# Patient Record
Sex: Female | Born: 1985 | Race: White | Hispanic: No | Marital: Married | Smoking: Former smoker
Health system: Southern US, Community
[De-identification: ages and names within clinical notes are randomized; demographics above are authoritative.]

## PROBLEM LIST (undated history)

## (undated) ENCOUNTER — Inpatient Hospital Stay: Admission: EM | Payer: Self-pay | Source: Home / Self Care

## (undated) ENCOUNTER — Inpatient Hospital Stay (HOSPITAL_COMMUNITY): Payer: Self-pay

## (undated) DIAGNOSIS — O24419 Gestational diabetes mellitus in pregnancy, unspecified control: Secondary | ICD-10-CM

## (undated) DIAGNOSIS — K859 Acute pancreatitis without necrosis or infection, unspecified: Secondary | ICD-10-CM

## (undated) DIAGNOSIS — N200 Calculus of kidney: Secondary | ICD-10-CM

## (undated) HISTORY — DX: Acute pancreatitis without necrosis or infection, unspecified: K85.90

## (undated) HISTORY — DX: Gestational diabetes mellitus in pregnancy, unspecified control: O24.419

---
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
## 2009-03-18 ENCOUNTER — Ambulatory Visit: Payer: Self-pay | Admitting: Family Medicine

## 2009-03-18 DIAGNOSIS — Z8719 Personal history of other diseases of the digestive system: Secondary | ICD-10-CM | POA: Insufficient documentation

## 2009-03-19 ENCOUNTER — Ambulatory Visit: Payer: Self-pay | Admitting: Family Medicine

## 2009-03-19 DIAGNOSIS — T23209A Burn of second degree of unspecified hand, unspecified site, initial encounter: Secondary | ICD-10-CM | POA: Insufficient documentation

## 2010-10-07 NOTE — Op Note (Signed)
Ruth Duran, Ruth Duran NO.:  [id number]   MEDICAL RECORD NO.:  [id number]          PATIENT TYPE:  INP   LOCATION:  9199                          FACILITY:  [facility]   PHYSICIAN:  Carrington Clamp, M.D. DATE OF BIRTH:  16-Dec-1985   DATE OF PROCEDURE:  11/30/2005  DATE OF DISCHARGE:                                 OPERATIVE REPORT   PREOPERATIVE DIAGNOSIS:  Third trimester bleeding, suspect abruption at  term.   POSTOPERATIVE DIAGNOSIS:  Abruption at term.   PROCEDURES:  Primary low transverse cesarean section.   ATTENDING:  Carrington Clamp, M.D.   ASSISTANT:  None.   ANESTHESIA:  Spinal.   SPECIMENS:  Placenta and cord gas.   DISPOSITION:  Pathology and blood bank respectively.   ESTIMATED BLOOD LOSS:  800 mL.   IV FLUIDS:  3200 mL.   URINE OUTPUT:  100 mL, clear.   COMPLICATIONS:  None.   FINDINGS:  Couvelaire's uterus.  There were also large clots seen coming out  with the placenta.  A female infant was found in vertex presentation, with  Apgars of seven and nine.  The cord pHs and weight is pending.  Weight was 6  pounds 5 ounces.  There were otherwise normal tubes and ovaries seen.   MEDICATIONS:  Ancef and Pitocin.  Counts were correct x3.   REASON FOR OPERATION:  Ruth Duran presented to maternity  admissions, at about 2:45 this afternoon, complaining of about 45 minutes of  bright red bleeding, heavier than a period.  When she was placed on the  monitor, after she got to the bathroom, the nurses noted the bright red  bleeding coming from the vagina and a decel to 80 beats per minute.  I was  then called and came right over to the hospital to evaluate.  I indeed found  that the patient was bleeding heavier right red blood than an average  period.  The cervix at that time was 1 cm and the bag of water appeared to  be intact.  The patient was complaining of severe pain that would only get  worse with the contractions, but would not let up  and between the  contractions, as well was the bright red bleeding.  At that time I suspected  abruption that was ongoing and continuing, and was going to be affecting the  baby, and made the decision for an emergent C-section.   The patient was taken the operating room where a spinal was placed and the  patient was prepped and draped in usual sterile fashion, in dorsal supine  position with leftward tilt.  A Pfannenstiel skin incision was made with  scalpel and carried down to the fascia with the Bovie cautery.  Fascia was  incised in midline with the scalpel and carried in transverse curvilinear  manner with the Mayo scissors.  Fascia was reflected superiorly and  inferiorly from the rectus muscles, and the rectus muscles split in the  midline.  The bowel free portion of peritoneum was entered into with the  Metzenbaum scissors, and then the peritoneum was stretched and opened in  superior and inferior manner with good visualization of the bowel and the  bladder.   The bladder blade was placed, and the vesicouterine fascia tented up and  incised in a transverse curvilinear manner, and bladder flap created.  A 2  cm incision was made in the upper portion of the lower uterine segment until  the amnion  and placenta could be seen.  The incision was extended  transversely with bandage scissors and the amnion ruptured and indicated  clear fluid.  The baby was delivered with vacuum extractor and the cord was  clamped and cut.  Baby was handed to awaiting pediatrics.  Cord gas was  obtained and cord bloods as well.   The placenta was then delivered manually and several large clots came out  with the placenta.  The uterus was then exteriorized, wrapped in wet lap,  and cleared of all debris.  It was at this point to be noted Couvelaire's  uterus, which indicated that an abruption had indeed incurred.  Uterine  incision was then closed with a running lock stitch of 0 Monocryl.  An   imbricating layer of 0 Monocryl was performed as well.  Hemostasis was  achieved with two additional figure-of-eight stitches.  The gutters were  cleared of all debris with irrigation.  The uterus approximated in the  abdomen.   The uterine incision was reinspected and found to be hemostatic, and the  peritoneum was then closed with a running stitch of 2-0 Vicryl.  This  incorporated a separate layer of rectus muscles as well.  The fascia was  closed with running stitch of 0 Vicryl.  Subcutaneous tissue was rendered  hemostatic with Bovie cautery and irrigation.  The skin was closed with  staples.  The patient tolerated the procedure well and was returned to the  recovery room in stable condition.      Carrington Clamp, M.D.  Electronically Signed     MH/MEDQ  D:  11/30/2005  T:  11/30/2005  Job:  [id number]

## 2010-10-07 NOTE — Discharge Summary (Signed)
Ruth Duran, DRUMMER NO.:  [id number]   MEDICAL RECORD NO.:  [id number]          PATIENT TYPE:  INP   LOCATION:  9142                          [facility]ACILITY:  [facility]   PHYSICIAN:  Carrington Clamp, M.D. DATE OF BIRTH:  Jul 05, 1985   DATE OF ADMISSION:  11/30/2005  DATE OF DISCHARGE:  12/02/2005                                 DISCHARGE SUMMARY   ADMITTING DIAGNOSIS:  Third trimester bleeding, suspect abruption.   POSTOPERATIVE DIAGNOSIS:  Abruption at term.   PERTINENT PROCEDURES PERFORMED:  Primary low transverse cesarean section.  Pertinent test results were a pre-op H&H of 12 and 37, and post-op H&H is  8.9 and 25.9.   HISTORY AND PHYSICAL/HOSPITAL COURSE:  Please refer to detailed history and  physical that is located on chart.   Briefly, this is a 25 year old G1, P0 at 63 and 3/7th's week complaining of  bleeding, heavy-like periods which started 45 minutes prior to her arriving  at the hospital.  Patient complained of contractions and was found to have  increased bleeding, bright red blood actively at the time of admission.  The  patient also had severe abdominal pain, and it was determined that it was a  likely abruption.  Therefore, the patient was taken immediately to the  operating room where a primary low-transverse cesarean section was  performed.  Findings were a Couvelaire's uterus with large clots with the  placenta.  A viable female infant was delivered with Apgar's 7 and 9.  Weight  was 6 pounds, 5 ounces.  The cord gas was obtained, but it is unable to be  found on the chart.  On postoperative day number 1, mother and baby were  doing well, and patient was without complications eating and ambulating.  On  postoperative day number 2, patient was eating, ambulating and voiding  without complication, and baby was doing well as well.   Mother was discharged with the following:  1. Tylox 1-2 every 4 hours as needed.  2. Motrin every 6 hours as needed.  3. Depo-Provera.  4. No driving for 2 weeks.  5. No sex for 6 weeks.  6. The patient is to call for increased bleeding or tenderness.  7. The patient is to return Tuesday at 9:00 for a pediatrician visit, and      staples were removed while she was in the hospital.      Carrington Clamp, M.D.  Electronically Signed     MH/MEDQ  D:  01/11/2006  T:  01/11/2006  Job:  [id number]

## 2013-05-04 ENCOUNTER — Inpatient Hospital Stay (HOSPITAL_COMMUNITY): Payer: Medicaid Other

## 2013-05-04 ENCOUNTER — Encounter (HOSPITAL_COMMUNITY): Payer: Self-pay

## 2013-05-04 ENCOUNTER — Inpatient Hospital Stay (HOSPITAL_COMMUNITY)
Admission: AD | Admit: 2013-05-04 | Discharge: 2013-05-04 | Disposition: A | Discharge status: Home / Self Care | Payer: Medicaid Other | Source: Ambulatory Visit | Attending: Obstetrics and Gynecology | Admitting: Obstetrics and Gynecology

## 2013-05-04 DIAGNOSIS — O3111X2 Continuing pregnancy after spontaneous abortion of one fetus or more, first trimester, fetus 2: Secondary | ICD-10-CM

## 2013-05-04 DIAGNOSIS — O99330 Smoking (tobacco) complicating pregnancy, unspecified trimester: Secondary | ICD-10-CM | POA: Insufficient documentation

## 2013-05-04 DIAGNOSIS — O209 Hemorrhage in early pregnancy, unspecified: Secondary | ICD-10-CM | POA: Insufficient documentation

## 2013-05-04 DIAGNOSIS — O30009 Twin pregnancy, unspecified number of placenta and unspecified number of amniotic sacs, unspecified trimester: Secondary | ICD-10-CM | POA: Insufficient documentation

## 2013-05-04 DIAGNOSIS — O30049 Twin pregnancy, dichorionic/diamniotic, unspecified trimester: Secondary | ICD-10-CM | POA: Insufficient documentation

## 2013-05-04 DIAGNOSIS — O4690 Antepartum hemorrhage, unspecified, unspecified trimester: Secondary | ICD-10-CM

## 2013-05-04 LAB — CBC
HCT: 37.1 % (ref 36.0–46.0)
Hemoglobin: 12.8 g/dL (ref 12.0–15.0)
MCH: 29.4 pg (ref 26.0–34.0)
MCHC: 34.5 g/dL (ref 30.0–36.0)
MCV: 85.1 fL (ref 78.0–100.0)
RBC: 4.36 MIL/uL (ref 3.87–5.11)

## 2013-05-04 LAB — ABO/RH: ABO/RH(D): B POS

## 2013-05-04 NOTE — MAU Note (Signed)
Pt states was seen at [facility] 04/27/2013 for vaginal bleeding.LMP-03/02/2013. Told she was pregnant with twins, and was given instructions for threatened miscarriage. Began bleeding heavier around 0400 this am. Has seen three lime sized blood clots. Was cramping however is not at present.

## 2013-05-04 NOTE — MAU Provider Note (Signed)
History     CSN: [id number]  Arrival date and time: 05/04/13 0946   None     Chief Complaint  Patient presents with  . Pregnant-Bleeding    HPI 27 y.o. [id number] at [redacted]w[redacted]d with vaginal bleeding and cramping x 1 week. Pt was seen at [facility] for bleeding last week, brought these records with her. U/S at that time showed Di/Di twin IUP at 6 weeks, + cardiac activity x 2. Pt states bleeding was heavier last Sunday, then decreased over the week, then increased again overnight after having intercourse last night. Has passed 3 lime sized clots. Pt went to [facility] overnight for eval of the bleeding, she states they did a pelvic exam and told her that her cervix was closed, no u/s was done there. She states she was not satisfied and still concerned after that visit, so came for eval here. She plans prenatal care at [facility].   History reviewed. No pertinent past medical history.  Past Surgical History  Procedure Laterality Date  . Cesarean section      History reviewed. No pertinent family history.  History  Substance Use Topics  . Smoking status: Current Every Day Smoker  . Smokeless tobacco: Not on file  . Alcohol Use: No    Allergies: No Known Allergies  No prescriptions prior to admission    Review of Systems  Constitutional: Negative.   Respiratory: Negative.   Cardiovascular: Negative.   Gastrointestinal: Negative for nausea, vomiting, abdominal pain, diarrhea and constipation.  Genitourinary: Negative for dysuria, urgency, frequency, hematuria and flank pain.       + cramping and bleeding   Musculoskeletal: Negative.   Neurological: Negative.   Psychiatric/Behavioral: Negative.    Physical Exam   Blood pressure 129/79, pulse 99, temperature 99.1 F (37.3 C), temperature source Oral, resp. rate 18, height 5\' 2"  (1.575 m), weight 210 lb 4 oz (95.369 kg), last menstrual period 03/02/2013.  Physical Exam  Nursing note and vitals  reviewed. Constitutional: She is oriented to person, place, and time. She appears well-developed and well-nourished. No distress.  Cardiovascular: Normal rate.   Respiratory: Effort normal.  GI: Soft. There is no tenderness.  Genitourinary: There is bleeding (moderate) around the vagina.  Cervix closed   Neurological: She is alert and oriented to person, place, and time.  Skin: Skin is warm and dry.  Psychiatric: She has a normal mood and affect.    MAU Course  Procedures  Results for orders placed during the hospital encounter of 05/04/13 (from the past 24 hour(s))  CBC     Status: Abnormal   Collection Time    05/04/13 10:17 AM      Result Value Range   WBC 11.3 (*) 4.0 - 10.5 K/uL   RBC 4.36  3.87 - 5.11 MIL/uL   Hemoglobin 12.8  12.0 - 15.0 g/dL   HCT 14.7  82.9 - 56.2 %   MCV 85.1  78.0 - 100.0 fL   MCH 29.4  26.0 - 34.0 pg   MCHC 34.5  30.0 - 36.0 g/dL   RDW 13.0  86.5 - 78.4 %   Platelets 262  150 - 400 K/uL  ABO/RH     Status: None   Collection Time    05/04/13 10:17 AM      Result Value Range   ABO/RH(D) B POS     US Ob Comp Less 14 Wks  05/04/2013   CLINICAL DATA:  Vaginal bleeding  EXAM: TWIN OBSTETRIC <14WK  [place] AND TRANSVAGINAL OB US  COMPARISON:  None.  FINDINGS: TWIN 1  Intrauterine gestational sac: Visualized and demonstrates mild irregularity.  Yolk sac:  Present.  Embryo:  Visualized  Cardiac Activity: Not appreciated  Heart Rate: Not applicable bpm  MSD:   mm    w     d  CRL:  12.4  mm   7 w 4d                  [place] EDC: 12/17/2013  TWIN 2  Intrauterine gestational sac: Visualized/normal in shape.  Yolk sac:  Present.  Embryo:  Visualized  Cardiac Activity: Appreciated  Heart Rate: 152 bpm  MSD:   mm    w     d  CRL:  14.5  mm   7 w 6 d                  [place] EDC: 12/15/2013  Maternal uterus/adnexae: A subchorionic hemorrhage is appreciated in the region of the fundus of the uterus adjacent to the gestational sac of 20 and 1. This finding demonstrates a focal rounded  area of increased echogenicity with peripheral more hyperechoic components. The sonographic findings likely reflect a component of acute and chronic hemorrhage. This area measures 7.1 x 4.6 x 4.7 cm in sagittal by AP by transverse dimensions.  The ovaries and adnexal regions unremarkable. Small amount of free fluid is identified within the pelvis.  IMPRESSION: Twin intrauterine pregnancies described above. Twin 2 appears viable.  Twin 1 demonstrates no sonographically appreciable cardiac activity, and there is a reported history of cardiac activity on the previous outside ultrasound. A moderate to large subchorionic hemorrhage is appreciated adjacent to the gestational sac of Twin 1. These findings are concerning for early fetal demise of Twin 1 which correlates with TWIN A on the ultrasound study images. Surveillance evaluation with ultrasound is recommended as clinically indicated.  These findings were relayed to Dr. Georges Mouse of the [facility] GYN service and is time of the interpretation.   Electronically Signed   By: Salome Holmes M.D.   On: 05/04/2013 11:19   US Ob Transvaginal  05/04/2013   CLINICAL DATA:  Vaginal bleeding  EXAM: TWIN OBSTETRIC <14WK [place] AND TRANSVAGINAL OB US  COMPARISON:  None.  FINDINGS: TWIN 1  Intrauterine gestational sac: Visualized and demonstrates mild irregularity.  Yolk sac:  Present.  Embryo:  Visualized  Cardiac Activity: Not appreciated  Heart Rate: Not applicable bpm  MSD:   mm    w     d  CRL:  12.4  mm   7 w 4d                  [place] EDC: 12/17/2013  TWIN 2  Intrauterine gestational sac: Visualized/normal in shape.  Yolk sac:  Present.  Embryo:  Visualized  Cardiac Activity: Appreciated  Heart Rate: 152 bpm  MSD:   mm    w     d  CRL:  14.5  mm   7 w 6 d                  [place] EDC: 12/15/2013  Maternal uterus/adnexae: A subchorionic hemorrhage is appreciated in the region of the fundus of the uterus adjacent to the gestational sac of 20 and 1. This finding demonstrates a focal  rounded area of increased echogenicity with peripheral more hyperechoic components. The sonographic findings likely reflect a component of acute and chronic hemorrhage. This area  measures 7.1 x 4.6 x 4.7 cm in sagittal by AP by transverse dimensions.  The ovaries and adnexal regions unremarkable. Small amount of free fluid is identified within the pelvis.  IMPRESSION: Twin intrauterine pregnancies described above. Twin 2 appears viable.  Twin 1 demonstrates no sonographically appreciable cardiac activity, and there is a reported history of cardiac activity on the previous outside ultrasound. A moderate to large subchorionic hemorrhage is appreciated adjacent to the gestational sac of Twin 1. These findings are concerning for early fetal demise of Twin 1 which correlates with TWIN A on the ultrasound study images. Surveillance evaluation with ultrasound is recommended as clinically indicated.  These findings were relayed to Dr. Georges Mouse of the [facility] GYN service and is time of the interpretation.   Electronically Signed   By: Salome Holmes M.D.   On: 05/04/2013 11:19    Assessment and Plan   1. Vaginal bleeding in pregnancy, first trimester   2. Continuing pregnancy after spontaneous abortion in first trimester, fetus 2   Discussed results, rev'd bleeding precautions, pt to follow up in [place] for [facility]    Medication List         calcium carbonate 500 MG chewable tablet  Commonly known as:  TUMS - dosed in mg elemental calcium  Chew 2 tablets by mouth daily as needed for indigestion or heartburn.     prenatal multivitamin Tabs tablet  Take 1 tablet by mouth daily at 12 noon.            Follow-up Information   Schedule an appointment as soon as possible for a visit with [facility] [employer] [facility] [place].   Specialty:  Obstetrics and Gynecology   Contact information:   [address], [address] [place] [address] [phone]         [facility] 05/04/2013, 10:22 AM

## 2013-05-08 ENCOUNTER — Ambulatory Visit (INDEPENDENT_AMBULATORY_CARE_PROVIDER_SITE_OTHER): Payer: Medicaid Other | Admitting: Obstetrics & Gynecology

## 2013-05-08 ENCOUNTER — Encounter: Payer: Self-pay | Admitting: Obstetrics & Gynecology

## 2013-05-08 ENCOUNTER — Other Ambulatory Visit (HOSPITAL_COMMUNITY)
Admission: RE | Admit: 2013-05-08 | Discharge: 2013-05-08 | Disposition: A | Discharge status: Home / Self Care | Payer: Medicaid Other | Source: Ambulatory Visit | Attending: Obstetrics & Gynecology | Admitting: Obstetrics & Gynecology

## 2013-05-08 VITALS — BP 140/89 | Wt 212.0 lb

## 2013-05-08 DIAGNOSIS — Z8742 Personal history of other diseases of the female genital tract: Secondary | ICD-10-CM

## 2013-05-08 DIAGNOSIS — O0990 Supervision of high risk pregnancy, unspecified, unspecified trimester: Secondary | ICD-10-CM

## 2013-05-08 DIAGNOSIS — Z01419 Encounter for gynecological examination (general) (routine) without abnormal findings: Secondary | ICD-10-CM | POA: Insufficient documentation

## 2013-05-08 DIAGNOSIS — O0991 Supervision of high risk pregnancy, unspecified, first trimester: Secondary | ICD-10-CM

## 2013-05-08 DIAGNOSIS — Z23 Encounter for immunization: Secondary | ICD-10-CM

## 2013-05-08 DIAGNOSIS — O09219 Supervision of pregnancy with history of pre-term labor, unspecified trimester: Secondary | ICD-10-CM

## 2013-05-08 DIAGNOSIS — Z8759 Personal history of other complications of pregnancy, childbirth and the puerperium: Secondary | ICD-10-CM | POA: Insufficient documentation

## 2013-05-08 DIAGNOSIS — O09211 Supervision of pregnancy with history of pre-term labor, first trimester: Secondary | ICD-10-CM | POA: Insufficient documentation

## 2013-05-08 DIAGNOSIS — Z113 Encounter for screening for infections with a predominantly sexual mode of transmission: Secondary | ICD-10-CM | POA: Insufficient documentation

## 2013-05-08 MED ORDER — INFLUENZA VAC SPLIT QUAD 0.5 ML IM SUSP: 0.5000 mL | Freq: Once | INTRAMUSCULAR | Status: DC
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
## 2013-05-08 NOTE — Addendum Note (Signed)
Addended by: Granville Lewis on: 05/08/2013 04:35 PM   Modules accepted: Orders

## 2013-05-08 NOTE — Addendum Note (Signed)
Addended by: Granville Lewis on: 05/08/2013 04:43 PM   Modules accepted: Orders

## 2013-05-08 NOTE — Progress Notes (Signed)
Bedside U/S showed IUP with FHT of 166 and CRL 16.90mm

## 2013-05-08 NOTE — Progress Notes (Signed)
P=93 

## 2013-05-09 LAB — OBSTETRIC PANEL
Antibody Screen: NEGATIVE
Basophils Absolute: 0 10*3/uL (ref 0.0–0.1)
Basophils Relative: 0 % (ref 0–1)
Eosinophils Relative: 3 % (ref 0–5)
HCT: 36.5 % (ref 36.0–46.0)
Hemoglobin: 12.5 g/dL (ref 12.0–15.0)
Hepatitis B Surface Ag: NEGATIVE
Lymphs Abs: 2.6 10*3/uL (ref 0.7–4.0)
MCHC: 34.2 g/dL (ref 30.0–36.0)
MCV: 85.5 fL (ref 78.0–100.0)
Monocytes Absolute: 0.7 10*3/uL (ref 0.1–1.0)
Monocytes Relative: 6 % (ref 3–12)
Neutro Abs: 6.7 10*3/uL (ref 1.7–7.7)
RDW: 13.5 % (ref 11.5–15.5)
Rh Type: POSITIVE
Rubella: 2.08 Index — ABNORMAL HIGH (ref <0.90)

## 2013-05-09 LAB — PRESCRIPTION MONITORING PROFILE (19 PANEL)
Amphetamine/Meth: NEGATIVE ng/mL
Barbiturate Screen, Urine: NEGATIVE ng/mL
Buprenorphine, Urine: NEGATIVE ng/mL
Cannabinoid Scrn, Ur: NEGATIVE ng/mL
Cocaine Metabolites: NEGATIVE ng/mL
Creatinine, Urine: 96.69 mg/dL (ref >20.0)
Fentanyl, Ur: NEGATIVE ng/mL
Methaqualone: NEGATIVE ng/mL
Nitrites, Initial: NEGATIVE ug/mL
Opiate Screen, Urine: NEGATIVE ng/mL
Phencyclidine, Ur: NEGATIVE ng/mL
Tapentadol, urine: NEGATIVE ng/mL

## 2013-05-09 LAB — HIV ANTIBODY (ROUTINE TESTING W REFLEX): HIV: NONREACTIVE

## 2013-05-09 NOTE — MAU Provider Note (Signed)
Attestation of Attending Supervision of Advanced Practitioner: Evaluation and management procedures were performed by the PA/NP/CNM/OB Fellow under my supervision/collaboration. Chart reviewed and agree with management and plan.  Illona Bulman V 05/09/2013 8:01 PM

## 2013-05-10 LAB — CULTURE, URINE COMPREHENSIVE: Organism ID, Bacteria: NO GROWTH

## 2013-06-03 ENCOUNTER — Other Ambulatory Visit: Payer: Self-pay | Admitting: Obstetrics & Gynecology

## 2013-06-03 DIAGNOSIS — Z3682 Encounter for antenatal screening for nuchal translucency: Secondary | ICD-10-CM

## 2013-06-05 ENCOUNTER — Other Ambulatory Visit: Payer: Self-pay | Admitting: Obstetrics & Gynecology

## 2013-06-05 ENCOUNTER — Encounter (HOSPITAL_COMMUNITY): Payer: Self-pay

## 2013-06-05 ENCOUNTER — Ambulatory Visit (INDEPENDENT_AMBULATORY_CARE_PROVIDER_SITE_OTHER): Payer: Medicaid Other | Admitting: Obstetrics & Gynecology

## 2013-06-05 ENCOUNTER — Ambulatory Visit (HOSPITAL_COMMUNITY): Payer: Medicaid Other

## 2013-06-05 ENCOUNTER — Ambulatory Visit (HOSPITAL_COMMUNITY)
Admission: RE | Admit: 2013-06-05 | Discharge: 2013-06-05 | Disposition: A | Discharge status: Home / Self Care | Payer: Medicaid Other | Source: Ambulatory Visit | Attending: Obstetrics & Gynecology | Admitting: Obstetrics & Gynecology

## 2013-06-05 VITALS — BP 124/71 | HR 100 | Wt 214.0 lb

## 2013-06-05 VITALS — BP 110/60 | Wt 214.0 lb

## 2013-06-05 DIAGNOSIS — Z3689 Encounter for other specified antenatal screening: Secondary | ICD-10-CM | POA: Insufficient documentation

## 2013-06-05 DIAGNOSIS — O351XX0 Maternal care for (suspected) chromosomal abnormality in fetus, not applicable or unspecified: Secondary | ICD-10-CM | POA: Insufficient documentation

## 2013-06-05 DIAGNOSIS — O0991 Supervision of high risk pregnancy, unspecified, first trimester: Secondary | ICD-10-CM

## 2013-06-05 DIAGNOSIS — O09299 Supervision of pregnancy with other poor reproductive or obstetric history, unspecified trimester: Secondary | ICD-10-CM | POA: Insufficient documentation

## 2013-06-05 DIAGNOSIS — Z3682 Encounter for antenatal screening for nuchal translucency: Secondary | ICD-10-CM

## 2013-06-05 DIAGNOSIS — Z363 Encounter for antenatal screening for malformations: Secondary | ICD-10-CM | POA: Insufficient documentation

## 2013-06-05 DIAGNOSIS — Z1389 Encounter for screening for other disorder: Secondary | ICD-10-CM | POA: Insufficient documentation

## 2013-06-05 DIAGNOSIS — O3510X0 Maternal care for (suspected) chromosomal abnormality in fetus, unspecified, not applicable or unspecified: Secondary | ICD-10-CM | POA: Insufficient documentation

## 2013-06-05 DIAGNOSIS — IMO0002 Reserved for concepts with insufficient information to code with codable children: Secondary | ICD-10-CM | POA: Insufficient documentation

## 2013-06-05 DIAGNOSIS — O0990 Supervision of high risk pregnancy, unspecified, unspecified trimester: Secondary | ICD-10-CM

## 2013-06-05 DIAGNOSIS — O34219 Maternal care for unspecified type scar from previous cesarean delivery: Secondary | ICD-10-CM | POA: Insufficient documentation

## 2013-06-05 NOTE — Progress Notes (Signed)
Pt has stopped bleeding (has history of twin demise--pt now has one fetus).  Needs quad screen (does not want first screen).  Will need the quad scree and start 17P net visit.  Order US next visit with MFM (anatomy).  Reveiwed weight gain for pregnancy.

## 2013-06-05 NOTE — Progress Notes (Signed)
Ruth Duran  was seen today for an ultrasound appointment.  See full report in [facility].  Impression: Single IUP at 12 3/7 weeks s/p early twin demise (twin A) 4.71 x 3.16 x 3.6 cm subchorionic fluid collection is noted - likely associated with early demise of twin A. Unable to measure an adequate NT due to fetal position  Discussed options - patient would prefer to have Quad screen performed rather than re-attempt next week.  Recommendations: Quad screen at 15-[redacted] weeks gestation Recommend ultrasound for fetal anatomy at 18-20 weeks.  Alpha GulaPaul Zarek Relph, MD

## 2013-06-05 NOTE — Progress Notes (Signed)
P = 92 

## 2013-06-08 ENCOUNTER — Inpatient Hospital Stay (HOSPITAL_COMMUNITY)
Admission: AD | Admit: 2013-06-08 | Discharge: 2013-06-09 | Disposition: A | Discharge status: Home / Self Care | Payer: Medicaid Other | Source: Ambulatory Visit | Attending: Obstetrics & Gynecology | Admitting: Obstetrics & Gynecology

## 2013-06-08 ENCOUNTER — Encounter (HOSPITAL_COMMUNITY): Payer: Self-pay

## 2013-06-08 DIAGNOSIS — O26852 Spotting complicating pregnancy, second trimester: Secondary | ICD-10-CM

## 2013-06-08 DIAGNOSIS — O26859 Spotting complicating pregnancy, unspecified trimester: Secondary | ICD-10-CM | POA: Insufficient documentation

## 2013-06-08 LAB — WET PREP, GENITAL
Clue Cells Wet Prep HPF POC: NONE SEEN
Trich, Wet Prep: NONE SEEN
YEAST WET PREP: NONE SEEN

## 2013-06-08 NOTE — MAU Provider Note (Signed)
History     CSN: [id number]  Arrival date and time: 06/08/13 2308   First Provider Initiated Contact with Patient 06/08/13 2335      Chief Complaint  Patient presents with  . Vaginal Bleeding   Vaginal Bleeding    Ruth Duran is a 28 y.o. [id number] at [redacted]w[redacted]d who presents today with a brown/watery discharge. She states that she had intercourse this morning, and has had brown and watery discharge all day. She denies any pain at this time. She denies any itching, odor or irritation.   Past Medical History  Diagnosis Date  . Pancreatitis   . Preterm labor     Past Surgical History  Procedure Laterality Date  . Cesarean section      x2    Family History  Problem Relation Age of Onset  . Diabetes Maternal Grandfather   . Heart attack Father     History  Substance Use Topics  . Smoking status: Current Every Day Smoker -- 0.20 packs/day for 4 years    Types: Cigarettes  . Smokeless tobacco: Never Used  . Alcohol Use: No    Allergies: No Known Allergies  Facility-administered medications prior to admission  Medication Dose Route Frequency Provider Last Rate Last Dose  . influenza vac split quadrivalent PF (FLUARIX) injection 0.5 mL  0.5 mL Intramuscular Once Lesly Dukes, MD       Prescriptions prior to admission  Medication Sig Dispense Refill  . calcium carbonate (TUMS - DOSED IN MG ELEMENTAL CALCIUM) 500 MG chewable tablet Chew 2 tablets by mouth daily as needed for indigestion or heartburn.      . Prenatal Vit-Fe Fumarate-FA (PRENATAL MULTIVITAMIN) TABS tablet Take 1 tablet by mouth daily at 12 noon.        Review of Systems  Genitourinary: Positive for vaginal bleeding.   Physical Exam   Blood pressure 120/87, pulse 96, temperature 98.7 F (37.1 C), temperature source Oral, resp. rate 18, height 5\' 2"  (1.575 m), weight 97.796 kg (215 lb 9.6 oz), last menstrual period 03/02/2013, SpO2 100.00%.  Physical Exam  Nursing note and vitals  reviewed. Constitutional: She is oriented to person, place, and time. She appears well-developed and well-nourished. No distress.  Cardiovascular: Normal rate.   Respiratory: Effort normal.  GI: Soft.  Genitourinary:   External: no lesion Vagina: scant amount of brown spotting seen. No pooling, no fluid seen with valsalva. Cervix: pink, smooth, no CMT, closed/thick Uterus: AGA, FHT with doppler   Neurological: She is alert and oriented to person, place, and time.  Skin: Skin is warm and dry.  Psychiatric: She has a normal mood and affect.    MAU Course  Procedures  Results for orders placed during the hospital encounter of 06/08/13 (from the past 24 hour(s))  WET PREP, GENITAL     Status: Abnormal   Collection Time    06/08/13 11:45 PM      Result Value Range   Yeast Wet Prep HPF POC NONE SEEN  NONE SEEN   Trich, Wet Prep NONE SEEN  NONE SEEN   Clue Cells Wet Prep HPF POC NONE SEEN  NONE SEEN   WBC, Wet Prep HPF POC RARE (*) NONE SEEN    Assessment and Plan   1. Spotting complicating pregnancy in second trimester    Bleeding precautions Pelvic rest until next appointment Return to MAU as needed  Follow-up Information   Follow up with [facility]. (as scheduled )    Specialty:  Obstetrics  and Gynecology   Contact information:   [address] [place] [address] [phone]      Ruth Duran, Ruth Duran 06/08/2013, 11:42 PM

## 2013-06-08 NOTE — MAU Note (Signed)
Started leaking fluid a few hours ago, some brown color to it but otherwise clear. Lower abdominal cramping started 30 minutes after the leaking started. Denies vaginal bleeding. Denies fever/cough/sore throat.

## 2013-06-09 ENCOUNTER — Encounter (HOSPITAL_COMMUNITY): Payer: Self-pay

## 2013-06-09 ENCOUNTER — Inpatient Hospital Stay (EMERGENCY_DEPARTMENT_HOSPITAL)
Admission: AD | Admit: 2013-06-09 | Discharge: 2013-06-09 | Disposition: A | Discharge status: Home / Self Care | Payer: Medicaid Other | Source: Ambulatory Visit | Attending: Family Medicine | Admitting: Family Medicine

## 2013-06-09 DIAGNOSIS — R51 Headache: Secondary | ICD-10-CM

## 2013-06-09 DIAGNOSIS — O26859 Spotting complicating pregnancy, unspecified trimester: Secondary | ICD-10-CM

## 2013-06-09 LAB — URINALYSIS, ROUTINE W REFLEX MICROSCOPIC
Bilirubin Urine: NEGATIVE
GLUCOSE, UA: NEGATIVE mg/dL
HGB URINE DIPSTICK: NEGATIVE
KETONES UR: 15 mg/dL — AB
Leukocytes, UA: NEGATIVE
Nitrite: NEGATIVE
PROTEIN: NEGATIVE mg/dL
Specific Gravity, Urine: 1.02 (ref 1.005–1.030)
Urobilinogen, UA: 0.2 mg/dL (ref 0.0–1.0)
pH: 5.5 (ref 5.0–8.0)

## 2013-06-09 MED ORDER — ACETAMINOPHEN 500 MG PO TABS
1000.0000 mg | ORAL_TABLET | Freq: Once | ORAL | Status: AC
  Administered 2013-06-09: 1000 mg via ORAL
  Filled 2013-06-09: qty 2

## 2013-06-09 NOTE — MAU Note (Signed)
Pt presents complaining of a clear leaking discharge with headache and dizziness today. Was seen in MAU yesterday for leaking fluid. States she has some spotting and cramping as well.

## 2013-06-09 NOTE — MAU Provider Note (Signed)
History     CSN: [id number]  Arrival date and time: 06/09/13 2025   First Provider Initiated Contact with Patient 06/09/13 2116      Chief Complaint  Patient presents with  . Headache  . Dizziness   HPI Ruth Duran is a 28 y.o. [id number] at [redacted]w[redacted]d who presents to MAU today with complaint of HA and dizziness. The patient was seen in MAU last night with complaint of ?LOF. The patient was fully evaluated and noted to have some post-coital discharge with minimal brown spotting. The patient states that the spotting has continued to be very minimal today. She has not noted significant LOF today. She states that she has had a headache all day. She has not taken anything for pain. She denies blurred vision. She did have some associated nausea after lunch today that resolved spontaneously. She denies N/V/D or constipation at this time.   OB History   Grav Para Term Preterm Abortions TAB SAB Ect Mult Living   3 2 1 1      2       Past Medical History  Diagnosis Date  . Pancreatitis   . Preterm labor     Past Surgical History  Procedure Laterality Date  . Cesarean section      x2    Family History  Problem Relation Age of Onset  . Diabetes Maternal Grandfather   . Heart attack Father     History  Substance Use Topics  . Smoking status: Current Every Day Smoker -- 0.20 packs/day for 4 years    Types: Cigarettes  . Smokeless tobacco: Never Used  . Alcohol Use: No    Allergies: No Known Allergies  Facility-administered medications prior to admission  Medication Dose Route Frequency Provider Last Rate Last Dose  . influenza vac split quadrivalent PF (FLUARIX) injection 0.5 mL  0.5 mL Intramuscular Once Lesly Dukes, MD       Prescriptions prior to admission  Medication Sig Dispense Refill  . Prenatal Vit-Fe Fumarate-FA (PRENATAL MULTIVITAMIN) TABS tablet Take 1 tablet by mouth daily at 12 noon.        Review of Systems  Constitutional: Negative for fever and  malaise/fatigue.  Eyes: Negative for blurred vision.  Gastrointestinal: Positive for nausea. Negative for vomiting, abdominal pain, diarrhea and constipation.  Genitourinary: Negative for dysuria, urgency and frequency.       + vaginal discharge, spotting  Neurological: Positive for dizziness and headaches. Negative for loss of consciousness.   Physical Exam   Blood pressure 121/72, pulse 95, temperature 98 F (36.7 C), temperature source Oral, resp. rate 18, last menstrual period 03/02/2013.  Physical Exam  Constitutional: She is oriented to person, place, and time. She appears well-developed and well-nourished. No distress.  HENT:  Head: Normocephalic and atraumatic.  Cardiovascular: Regular rhythm.  Tachycardia present.   Respiratory: Effort normal.  GI: Soft. Bowel sounds are normal. She exhibits no distension and no mass. There is no tenderness. There is no rebound and no guarding.  Neurological: She is alert and oriented to person, place, and time.  Skin: Skin is warm and dry. No erythema.  Psychiatric: She has a normal mood and affect.   Results for orders placed during the hospital encounter of 06/09/13 (from the past 24 hour(s))  URINALYSIS, ROUTINE W REFLEX MICROSCOPIC     Status: Abnormal   Collection Time    06/09/13  8:45 PM      Result Value Range   Color, Urine YELLOW  YELLOW   APPearance CLEAR  CLEAR   Specific Gravity, Urine 1.020  1.005 - 1.030   pH 5.5  5.0 - 8.0   Glucose, UA NEGATIVE  NEGATIVE mg/dL   Hgb urine dipstick NEGATIVE  NEGATIVE   Bilirubin Urine NEGATIVE  NEGATIVE   Ketones, ur 15 (*) NEGATIVE mg/dL   Protein, ur NEGATIVE  NEGATIVE mg/dL   Urobilinogen, UA 0.2  0.0 - 1.0 mg/dL   Nitrite NEGATIVE  NEGATIVE   Leukocytes, UA NEGATIVE  NEGATIVE    MAU Course  Procedures None  MDM FHR - 161 bpm by doppler Patient is very anxious appearing.  Notes in Epic reflect demise of Twin A during this pregnancy UA today shows mild dehydration Patient  given 1000 mg Tylenol and pitcher of water Vital signs are within normal limits Headache symptoms have improved since earlier today Discussed dehydration as a possible cause for headache Assessment and Plan  A: Headache  P: Discharge home Recommended Tylenol PRN for pain Patient advised to increase PO hydration  Patient [facility] Patient may return to MAU as needed or if her condition were to change or worsen  Freddi StarrJulie N Ethier, PA-C  06/09/2013, 9:16 PM

## 2013-06-09 NOTE — Discharge Instructions (Signed)
Pelvic Rest °Pelvic rest is sometimes recommended for women when:  °· The placenta is partially or completely covering the opening of the cervix (placenta previa). °· There is bleeding between the uterine wall and the amniotic sac in the first trimester (subchorionic hemorrhage). °· The cervix begins to open without labor starting (incompetent cervix, cervical insufficiency). °· The labor is too early (preterm labor). °HOME CARE INSTRUCTIONS °· Do not have sexual intercourse, stimulation, or an orgasm. °· Do not use tampons, douche, or put anything in the vagina. °· Do not lift anything over 10 pounds (4.5 kg). °· Avoid strenuous activity or straining your pelvic muscles. °SEEK MEDICAL CARE IF:  °· You have any vaginal bleeding during pregnancy. Treat this as a potential emergency. °· You have cramping pain felt low in the stomach (stronger than menstrual cramps). °· You notice vaginal discharge (watery, mucus, or bloody). °· You have a low, dull backache. °· There are regular contractions or uterine tightening. °SEEK IMMEDIATE MEDICAL CARE IF: °You have vaginal bleeding and have placenta previa.  °Document Released: 09/02/2010 Document Revised: 07/31/2011 Document Reviewed: 09/02/2010 °Exit[employer]® Patient Information ©2014 [employer]. ° °

## 2013-06-09 NOTE — Discharge Instructions (Signed)
Dehydration, Adult Dehydration means your body does not have as much fluid as it needs. Your kidneys, brain, and heart will not work properly without the right amount of fluids and salt.  HOME CARE  Ask your doctor how to replace body fluid losses (rehydrate).  Drink enough fluids to keep your pee (urine) clear or pale yellow.  Drink small amounts of fluids often if you feel sick to your stomach (nauseous) or throw up (vomit).  Eat like you normally do.  Avoid:  Foods or drinks high in sugar.  Bubbly (carbonated) drinks.  Juice.  Very hot or cold fluids.  Drinks with caffeine.  Fatty, greasy foods.  Alcohol.  Tobacco.  Eating too much.  Gelatin desserts.  Wash your hands to avoid spreading germs (bacteria, viruses).  Only take medicine as told by your doctor.  Keep all doctor visits as told. GET HELP RIGHT AWAY IF:   You cannot drink something without throwing up.  You get worse even with treatment.  Your vomit has blood in it or looks greenish.  Your poop (stool) has blood in it or looks black and tarry.  You have not peed in 6 to 8 hours.  You pee a small amount of very dark pee.  You have a fever.  You pass out (faint).  You have belly (abdominal) pain that gets worse or stays in one spot (localizes).  You have a rash, stiff neck, or bad headache.  You get easily annoyed, sleepy, or are hard to wake up.  You feel weak, dizzy, or very thirsty. MAKE SURE YOU:   Understand these instructions.  Will watch your condition.  Will get help right away if you are not doing well or get worse. Document Released: 03/04/2009 Document Revised: 07/31/2011 Document Reviewed: 12/26/2010 [facility] Patient Information 2014 [place], [place]. Headaches, Frequently Asked Questions MIGRAINE HEADACHES Q: What is migraine? What causes it? How can I treat it? A: Generally, migraine headaches begin as a dull ache. Then they develop into a constant, throbbing, and  pulsating pain. You may experience pain at the temples. You may experience pain at the front or back of one or both sides of the head. The pain is usually accompanied by a combination of:  Nausea.  Vomiting.  Sensitivity to light and noise. Some people (about 15%) experience an aura (see below) before an attack. The cause of migraine is believed to be chemical reactions in the brain. Treatment for migraine may include over-the-counter or prescription medications. It may also include self-help techniques. These include relaxation training and biofeedback.  Q: What is an aura? A: About 15% of people with migraine get an "aura". This is a sign of neurological symptoms that occur before a migraine headache. You may see wavy or jagged lines, dots, or flashing lights. You might experience tunnel vision or blind spots in one or both eyes. The aura can include visual or auditory hallucinations (something imagined). It may include disruptions in smell (such as strange odors), taste or touch. Other symptoms include:  Numbness.  A "pins and needles" sensation.  Difficulty in recalling or speaking the correct word. These neurological events may last as long as 60 minutes. These symptoms will fade as the headache begins. Q: What is a trigger? A: Certain physical or environmental factors can lead to or "trigger" a migraine. These include:  Foods.  Hormonal changes.  Weather.  Stress. It is important to remember that triggers are different for everyone. To help prevent migraine attacks, you need  to figure out which triggers affect you. Keep a headache diary. This is a good way to track triggers. The diary will help you talk to your healthcare professional about your condition. Q: Does weather affect migraines? A: Bright sunshine, hot, humid conditions, and drastic changes in barometric pressure may lead to, or "trigger," a migraine attack in some people. But studies have shown that weather does not act  as a trigger for everyone with migraines. Q: What is the link between migraine and hormones? A: Hormones start and regulate many of your body's functions. Hormones keep your body in balance within a constantly changing environment. The levels of hormones in your body are unbalanced at times. Examples are during menstruation, pregnancy, or menopause. That can lead to a migraine attack. In fact, about three quarters of all women with migraine report that their attacks are related to the menstrual cycle.  Q: Is there an increased risk of stroke for migraine sufferers? A: The likelihood of a migraine attack causing a stroke is very remote. That is not to say that migraine sufferers cannot have a stroke associated with their migraines. In persons under age 26, the most common associated factor for stroke is migraine headache. But over the course of a person's normal life span, the occurrence of migraine headache may actually be associated with a reduced risk of dying from cerebrovascular disease due to stroke.  Q: What are acute medications for migraine? A: Acute medications are used to treat the pain of the headache after it has started. Examples over-the-counter medications, NSAIDs, ergots, and triptans.  Q: What are the triptans? A: Triptans are the newest class of abortive medications. They are specifically targeted to treat migraine. Triptans are vasoconstrictors. They moderate some chemical reactions in the brain. The triptans work on receptors in your brain. Triptans help to restore the balance of a neurotransmitter called serotonin. Fluctuations in levels of serotonin are thought to be a main cause of migraine.  Q: Are over-the-counter medications for migraine effective? A: Over-the-counter, or "OTC," medications may be effective in relieving mild to moderate pain and associated symptoms of migraine. But you should see your caregiver before beginning any treatment regimen for migraine.  Q: What are  preventive medications for migraine? A: Preventive medications for migraine are sometimes referred to as "prophylactic" treatments. They are used to reduce the frequency, severity, and length of migraine attacks. Examples of preventive medications include antiepileptic medications, antidepressants, beta-blockers, calcium channel blockers, and NSAIDs (nonsteroidal anti-inflammatory drugs). Q: Why are anticonvulsants used to treat migraine? A: During the past few years, there has been an increased interest in antiepileptic drugs for the prevention of migraine. They are sometimes referred to as "anticonvulsants". Both epilepsy and migraine may be caused by similar reactions in the brain.  Q: Why are antidepressants used to treat migraine? A: Antidepressants are typically used to treat people with depression. They may reduce migraine frequency by regulating chemical levels, such as serotonin, in the brain.  Q: What alternative therapies are used to treat migraine? A: The term "alternative therapies" is often used to describe treatments considered outside the scope of conventional Western medicine. Examples of alternative therapy include acupuncture, acupressure, and yoga. Another common alternative treatment is herbal therapy. Some herbs are believed to relieve headache pain. Always discuss alternative therapies with your caregiver before proceeding. Some herbal products contain arsenic and other toxins. TENSION HEADACHES Q: What is a tension-type headache? What causes it? How can I treat it? A: Tension-type headaches occur randomly.  They are often the result of temporary stress, anxiety, fatigue, or anger. Symptoms include soreness in your temples, a tightening band-like sensation around your head (a "vice-like" ache). Symptoms can also include a pulling feeling, pressure sensations, and contracting head and neck muscles. The headache begins in your forehead, temples, or the back of your head and neck.  Treatment for tension-type headache may include over-the-counter or prescription medications. Treatment may also include self-help techniques such as relaxation training and biofeedback. CLUSTER HEADACHES Q: What is a cluster headache? What causes it? How can I treat it? A: Cluster headache gets its name because the attacks come in groups. The pain arrives with little, if any, warning. It is usually on one side of the head. A tearing or bloodshot eye and a runny nose on the same side of the headache may also accompany the pain. Cluster headaches are believed to be caused by chemical reactions in the brain. They have been described as the most severe and intense of any headache type. Treatment for cluster headache includes prescription medication and oxygen. SINUS HEADACHES Q: What is a sinus headache? What causes it? How can I treat it? A: When a cavity in the bones of the face and skull (a sinus) becomes inflamed, the inflammation will cause localized pain. This condition is usually the result of an allergic reaction, a tumor, or an infection. If your headache is caused by a sinus blockage, such as an infection, you will probably have a fever. An x-ray will confirm a sinus blockage. Your caregiver's treatment might include antibiotics for the infection, as well as antihistamines or decongestants.  REBOUND HEADACHES Q: What is a rebound headache? What causes it? How can I treat it? A: A pattern of taking acute headache medications too often can lead to a condition known as "rebound headache." A pattern of taking too much headache medication includes taking it more than 2 days per week or in excessive amounts. That means more than the label or a caregiver advises. With rebound headaches, your medications not only stop relieving pain, they actually begin to cause headaches. Doctors treat rebound headache by tapering the medication that is being overused. Sometimes your caregiver will gradually substitute a  different type of treatment or medication. Stopping may be a challenge. Regularly overusing a medication increases the potential for serious side effects. Consult a caregiver if you regularly use headache medications more than 2 days per week or more than the label advises. ADDITIONAL QUESTIONS AND ANSWERS Q: What is biofeedback? A: Biofeedback is a self-help treatment. Biofeedback uses special equipment to monitor your body's involuntary physical responses. Biofeedback monitors:  Breathing.  Pulse.  Heart rate.  Temperature.  Muscle tension.  Brain activity. Biofeedback helps you refine and perfect your relaxation exercises. You learn to control the physical responses that are related to stress. Once the technique has been mastered, you do not need the equipment any more. Q: Are headaches hereditary? A: Four out of five (80%) of people that suffer report a family history of migraine. Scientists are not sure if this is genetic or a family predisposition. Despite the uncertainty, a child has a 50% chance of having migraine if one parent suffers. The child has a 75% chance if both parents suffer.  Q: Can children get headaches? A: By the time they reach high school, most young people have experienced some type of headache. Many safe and effective approaches or medications can prevent a headache from occurring or stop it after it has  begun.  Q: What type of doctor should I see to diagnose and treat my headache? A: Start with your primary caregiver. Discuss his or her experience and approach to headaches. Discuss methods of classification, diagnosis, and treatment. Your caregiver may decide to recommend you to a headache specialist, depending upon your symptoms or other physical conditions. Having diabetes, allergies, etc., may require a more comprehensive and inclusive approach to your headache. The National Head[employer] Foundation will provide, upon request, a list of [facility] physician members in your  state. Document Released: 07/29/2003 Document Revised: 07/31/2011 Document Reviewed: 01/06/2008 [facility] Patient Information 2014 [place], [place].

## 2013-06-10 ENCOUNTER — Encounter: Payer: Medicaid Other | Admitting: Obstetrics & Gynecology

## 2013-06-10 NOTE — MAU Provider Note (Signed)
Attestation of Attending Supervision of Advanced Practitioner (PA/CNM/NP): Evaluation and management procedures were performed by the Advanced Practitioner under my supervision and collaboration.  I have reviewed the Advanced Practitioner's note and chart, and I agree with the management and plan.  PRATT,TANYA S, MD [facility] Attending 06/10/2013 6:24 AM   

## 2013-06-23 ENCOUNTER — Ambulatory Visit (INDEPENDENT_AMBULATORY_CARE_PROVIDER_SITE_OTHER): Payer: Medicaid Other | Admitting: Advanced Practice Midwife

## 2013-06-23 VITALS — BP 126/79 | Wt 216.0 lb

## 2013-06-23 DIAGNOSIS — O26852 Spotting complicating pregnancy, second trimester: Secondary | ICD-10-CM

## 2013-06-23 DIAGNOSIS — Z3480 Encounter for supervision of other normal pregnancy, unspecified trimester: Secondary | ICD-10-CM

## 2013-06-23 DIAGNOSIS — O9989 Other specified diseases and conditions complicating pregnancy, childbirth and the puerperium: Secondary | ICD-10-CM

## 2013-06-23 DIAGNOSIS — N898 Other specified noninflammatory disorders of vagina: Secondary | ICD-10-CM

## 2013-06-23 DIAGNOSIS — O26859 Spotting complicating pregnancy, unspecified trimester: Secondary | ICD-10-CM

## 2013-06-23 DIAGNOSIS — O26892 Other specified pregnancy related conditions, second trimester: Secondary | ICD-10-CM

## 2013-06-23 DIAGNOSIS — Z3492 Encounter for supervision of normal pregnancy, unspecified, second trimester: Secondary | ICD-10-CM

## 2013-06-23 NOTE — Progress Notes (Signed)
P - 87 - Pt states she has some concerns about pregnancy - experienced brownish discharge and abd feels "soft"

## 2013-06-23 NOTE — Progress Notes (Signed)
Continues to have thin, brown odorless discharge. [facility]. No recent IC. No irritation, itching. Scant thin brown discharge seen on SSE. Fern neg. Wet Prep, GC/CT collected. Anatomy scan ordered. Suspect [facility]. Pelvic rest.

## 2013-06-23 NOTE — Progress Notes (Signed)
[id number] 

## 2013-06-23 NOTE — Patient Instructions (Signed)
Subchorionic Hematoma A subchorionic hematoma is a gathering of blood between the outer wall of the placenta and the inner wall of the womb (uterus). The placenta is the organ that connects the fetus to the wall of the uterus. The placenta performs the feeding, breathing (oxygen to the fetus), and waste removal (excretory work) of the fetus.  Subchorionic hematoma is the most common abnormality found on a result from ultrasonography done during the first trimester or early second trimester of pregnancy. If there has been little or no vaginal bleeding, early small hematomas usually shrink on their own and do not affect your baby or pregnancy. The blood is gradually absorbed over 1 2 weeks. When bleeding starts later in pregnancy or the hematoma is larger or occurs in an older pregnant woman, the outcome may not be as good. Larger hematomas may get bigger, which increases the chances for miscarriage. Subchorionic hematoma also increases the risk of premature detachment of the placenta from the uterus, preterm (premature) labor, and stillbirth. HOME CARE INSTRUCTIONS   Stay on bed rest if your health care provider recommends this. Although bed rest will not prevent more bleeding or prevent a miscarriage, your health care provider may recommend bed rest until you are advised otherwise.  Avoid heavy lifting (more than 10 lb [4.5 kg]), exercise, sexual intercourse, or douching as directed by your health care provider.  Keep track of the number of pads you use each day and how soaked (saturated) they are. Write down this information.  Do not use tampons.  Keep all follow-up appointments as directed by your health care provider. Your health care provider may ask you to have follow-up blood tests or ultrasound tests or both. SEEK IMMEDIATE MEDICAL CARE IF:   You have severe cramps in your stomach, back, abdomen, or pelvis.  You have a fever.  You pass large clots or tissue. Save any tissue for your health  care provider to look at.  Your bleeding increases or you become lightheaded, feel weak, or have fainting episodes. Document Released: 08/23/2006 Document Revised: 02/26/2013 Document Reviewed: 12/05/2012 [facility] Patient Information 2014 [place], [place].  Second Trimester of Pregnancy The second trimester is from week 13 through week 28, months 4 through 6. The second trimester is often a time when you feel your best. Your body has also adjusted to being pregnant, and you begin to feel better physically. Usually, morning sickness has lessened or quit completely, you may have more energy, and you may have an increase in appetite. The second trimester is also a time when the fetus is growing rapidly. At the end of the sixth month, the fetus is about 9 inches long and weighs about 1 pounds. You will likely begin to feel the baby move (quickening) between 18 and 20 weeks of the pregnancy. BODY CHANGES Your body goes through many changes during pregnancy. The changes vary from woman to woman.   Your weight will continue to increase. You will notice your lower abdomen bulging out.  You may begin to get stretch marks on your hips, abdomen, and breasts.  You may develop headaches that can be relieved by medicines approved by your caregiver.  You may urinate more often because the fetus is pressing on your bladder.  You may develop or continue to have heartburn as a result of your pregnancy.  You may develop constipation because certain hormones are causing the muscles that push waste through your intestines to slow down.  You may develop hemorrhoids or swollen, bulging  veins (varicose veins).  You may have back pain because of the weight gain and pregnancy hormones relaxing your joints between the bones in your pelvis and as a result of a shift in weight and the muscles that support your balance.  Your breasts will continue to grow and be tender.  Your gums may bleed and may be sensitive to  brushing and flossing.  Dark spots or blotches (chloasma, mask of pregnancy) may develop on your face. This will likely fade after the baby is born.  A dark line from your belly button to the pubic area (linea nigra) may appear. This will likely fade after the baby is born. WHAT TO EXPECT AT YOUR PRENATAL VISITS During a routine prenatal visit:  You will be weighed to make sure you and the fetus are growing normally.  Your blood pressure will be taken.  Your abdomen will be measured to track your baby's growth.  The fetal heartbeat will be listened to.  Any test results from the previous visit will be discussed. Your caregiver may ask you:  How you are feeling.  If you are feeling the baby move.  If you have had any abnormal symptoms, such as leaking fluid, bleeding, severe headaches, or abdominal cramping.  If you have any questions. Other tests that may be performed during your second trimester include:  Blood tests that check for:  Low iron levels (anemia).  Gestational diabetes (between 24 and 28 weeks).  Rh antibodies.  Urine tests to check for infections, diabetes, or protein in the urine.  An ultrasound to confirm the proper growth and development of the baby.  An amniocentesis to check for possible genetic problems.  Fetal screens for spina bifida and Down syndrome. HOME CARE INSTRUCTIONS   Avoid all smoking, herbs, alcohol, and unprescribed drugs. These chemicals affect the formation and growth of the baby.  Follow your caregiver's instructions regarding medicine use. There are medicines that are either safe or unsafe to take during pregnancy.  Exercise only as directed by your caregiver. Experiencing uterine cramps is a good sign to stop exercising.  Continue to eat regular, healthy meals.  Wear a good support bra for breast tenderness.  Do not use hot tubs, steam rooms, or saunas.  Wear your seat belt at all times when driving.  Avoid raw meat,  uncooked cheese, cat litter boxes, and soil used by cats. These carry germs that can cause birth defects in the baby.  Take your prenatal vitamins.  Try taking a stool softener (if your caregiver approves) if you develop constipation. Eat more high-fiber foods, such as fresh vegetables or fruit and whole grains. Drink plenty of fluids to keep your urine clear or pale yellow.  Take warm sitz baths to soothe any pain or discomfort caused by hemorrhoids. Use hemorrhoid cream if your caregiver approves.  If you develop varicose veins, wear support hose. Elevate your feet for 15 minutes, 3 4 times a day. Limit salt in your diet.  Avoid heavy lifting, wear low heel shoes, and practice good posture.  Rest with your legs elevated if you have leg cramps or low back pain.  Visit your dentist if you have not gone yet during your pregnancy. Use a soft toothbrush to brush your teeth and be gentle when you floss.  A sexual relationship may be continued unless your caregiver directs you otherwise.  Continue to go to all your prenatal visits as directed by your caregiver. SEEK MEDICAL CARE IF:   You have  dizziness.  You have mild pelvic cramps, pelvic pressure, or nagging pain in the abdominal area.  You have persistent nausea, vomiting, or diarrhea.  You have a bad smelling vaginal discharge.  You have pain with urination. SEEK IMMEDIATE MEDICAL CARE IF:   You have a fever.  You are leaking fluid from your vagina.  You have spotting or bleeding from your vagina.  You have severe abdominal cramping or pain.  You have rapid weight gain or loss.  You have shortness of breath with chest pain.  You notice sudden or extreme swelling of your face, hands, ankles, feet, or legs.  You have not felt your baby move in over an hour.  You have severe headaches that do not go away with medicine.  You have vision changes. Document Released: 05/02/2001 Document Revised: 01/08/2013 Document  Reviewed: 07/09/2012 [facility] Patient Information 2014 [place], [place].

## 2013-06-24 LAB — GC/CHLAMYDIA PROBE AMP
CT Probe RNA: NEGATIVE
GC Probe RNA: NEGATIVE

## 2013-06-25 LAB — WET PREP BY MOLECULAR PROBE
CANDIDA SPECIES: NEGATIVE
GARDNERELLA VAGINALIS: NEGATIVE
TRICHOMONAS VAG: NEGATIVE

## 2013-07-02 ENCOUNTER — Encounter: Payer: Self-pay | Admitting: Obstetrics & Gynecology

## 2013-07-02 ENCOUNTER — Ambulatory Visit (INDEPENDENT_AMBULATORY_CARE_PROVIDER_SITE_OTHER): Payer: Medicaid Other | Admitting: Obstetrics & Gynecology

## 2013-07-02 VITALS — BP 108/64 | Wt 216.0 lb

## 2013-07-02 DIAGNOSIS — O0990 Supervision of high risk pregnancy, unspecified, unspecified trimester: Secondary | ICD-10-CM

## 2013-07-02 DIAGNOSIS — O0991 Supervision of high risk pregnancy, unspecified, first trimester: Secondary | ICD-10-CM

## 2013-07-02 DIAGNOSIS — Z8751 Personal history of pre-term labor: Secondary | ICD-10-CM

## 2013-07-02 DIAGNOSIS — O09219 Supervision of pregnancy with history of pre-term labor, unspecified trimester: Secondary | ICD-10-CM

## 2013-07-02 MED ORDER — HYDROXYPROGESTERONE CAPROATE 250 MG/ML IM OIL
250.0000 mg | TOPICAL_OIL | INTRAMUSCULAR | Status: DC
  Administered 2013-07-02: 250 mg via INTRAMUSCULAR

## 2013-07-02 NOTE — Progress Notes (Signed)
Routine visit. Some +/- fetal movement. No bleeding, contractions, or ROM. Denies any problems. 17-OH-P today and every week. Anatomy u/s for 20 weeks can be ordered at next visit (when EPIC is functioning during her visit).

## 2013-07-02 NOTE — Progress Notes (Signed)
Begins 17-P today

## 2013-07-09 ENCOUNTER — Ambulatory Visit (INDEPENDENT_AMBULATORY_CARE_PROVIDER_SITE_OTHER): Payer: Medicaid Other | Admitting: *Deleted

## 2013-07-09 DIAGNOSIS — O09219 Supervision of pregnancy with history of pre-term labor, unspecified trimester: Secondary | ICD-10-CM

## 2013-07-09 DIAGNOSIS — O09899 Supervision of other high risk pregnancies, unspecified trimester: Secondary | ICD-10-CM

## 2013-07-09 MED ORDER — HYDROXYPROGESTERONE CAPROATE 250 MG/ML IM OIL
250.0000 mg | TOPICAL_OIL | INTRAMUSCULAR | Status: AC
  Administered 2013-07-09 – 2013-08-06 (×5): 250 mg via INTRAMUSCULAR

## 2013-07-16 ENCOUNTER — Ambulatory Visit (INDEPENDENT_AMBULATORY_CARE_PROVIDER_SITE_OTHER): Payer: Medicaid Other | Admitting: *Deleted

## 2013-07-16 VITALS — BP 100/60 | HR 100 | Resp 16

## 2013-07-16 DIAGNOSIS — O09219 Supervision of pregnancy with history of pre-term labor, unspecified trimester: Secondary | ICD-10-CM

## 2013-07-16 DIAGNOSIS — O09211 Supervision of pregnancy with history of pre-term labor, first trimester: Secondary | ICD-10-CM

## 2013-07-21 ENCOUNTER — Inpatient Hospital Stay (HOSPITAL_COMMUNITY)
Admission: AD | Admit: 2013-07-21 | Discharge: 2013-07-21 | Disposition: A | Discharge status: Home / Self Care | Payer: Medicaid Other | Source: Ambulatory Visit | Attending: Obstetrics & Gynecology | Admitting: Obstetrics & Gynecology

## 2013-07-21 ENCOUNTER — Encounter (HOSPITAL_COMMUNITY): Payer: Self-pay | Admitting: *Deleted

## 2013-07-21 ENCOUNTER — Ambulatory Visit (HOSPITAL_COMMUNITY)
Admission: RE | Admit: 2013-07-21 | Discharge: 2013-07-21 | Disposition: A | Discharge status: Home / Self Care | Payer: Medicaid Other | Source: Ambulatory Visit | Attending: Obstetrics & Gynecology | Admitting: Obstetrics & Gynecology

## 2013-07-21 VITALS — BP 109/52 | HR 69 | Wt 220.0 lb

## 2013-07-21 DIAGNOSIS — Z8751 Personal history of pre-term labor: Secondary | ICD-10-CM | POA: Insufficient documentation

## 2013-07-21 DIAGNOSIS — IMO0002 Reserved for concepts with insufficient information to code with codable children: Secondary | ICD-10-CM | POA: Insufficient documentation

## 2013-07-21 DIAGNOSIS — O0990 Supervision of high risk pregnancy, unspecified, unspecified trimester: Secondary | ICD-10-CM | POA: Insufficient documentation

## 2013-07-21 DIAGNOSIS — O0991 Supervision of high risk pregnancy, unspecified, first trimester: Secondary | ICD-10-CM

## 2013-07-21 DIAGNOSIS — O34219 Maternal care for unspecified type scar from previous cesarean delivery: Secondary | ICD-10-CM | POA: Insufficient documentation

## 2013-07-21 DIAGNOSIS — O09299 Supervision of pregnancy with other poor reproductive or obstetric history, unspecified trimester: Secondary | ICD-10-CM | POA: Insufficient documentation

## 2013-07-21 DIAGNOSIS — R109 Unspecified abdominal pain: Secondary | ICD-10-CM | POA: Insufficient documentation

## 2013-07-21 DIAGNOSIS — O358XX0 Maternal care for other (suspected) fetal abnormality and damage, not applicable or unspecified: Secondary | ICD-10-CM | POA: Insufficient documentation

## 2013-07-21 DIAGNOSIS — Z363 Encounter for antenatal screening for malformations: Secondary | ICD-10-CM | POA: Insufficient documentation

## 2013-07-21 DIAGNOSIS — O4102X0 Oligohydramnios, second trimester, not applicable or unspecified: Secondary | ICD-10-CM

## 2013-07-21 DIAGNOSIS — O4100X0 Oligohydramnios, unspecified trimester, not applicable or unspecified: Secondary | ICD-10-CM | POA: Insufficient documentation

## 2013-07-21 DIAGNOSIS — Z3492 Encounter for supervision of normal pregnancy, unspecified, second trimester: Secondary | ICD-10-CM

## 2013-07-21 DIAGNOSIS — O99330 Smoking (tobacco) complicating pregnancy, unspecified trimester: Secondary | ICD-10-CM | POA: Insufficient documentation

## 2013-07-21 DIAGNOSIS — O26852 Spotting complicating pregnancy, second trimester: Secondary | ICD-10-CM

## 2013-07-21 DIAGNOSIS — Z1389 Encounter for screening for other disorder: Secondary | ICD-10-CM | POA: Insufficient documentation

## 2013-07-21 LAB — AMNISURE RUPTURE OF MEMBRANE (ROM) NOT AT ARMC: Amnisure ROM: NEGATIVE

## 2013-07-21 NOTE — MAU Note (Signed)
Patient was seen in Maternal Fetal Medicine today for an ultrasound and had no amniotic fluid. States she was seen in MAU for cramping and leaking fluids about one month ago. Patient reports mild cramping off and on. Patient sent to MAU for further evaluation.

## 2013-07-21 NOTE — MAU Provider Note (Signed)
History     CSN: [id number]  Arrival date and time: 07/21/13 1035   None     No chief complaint on file.  HPI  [name] is 28 y.o. [id number] at [redacted]w[redacted]d who was sent over from MFM due to anatomy scan showing anhydramnios. The patient was seen on January 18 for ?ROM; testing showed negative for ROM.  The patient felt she had stopped leaking fluid shortly after her visit in January. The patient was sent here for Amnisure to assess for ROM by MFM. The patient has been experieincing abdominal cramping everyday for weeks. "Its never been severe".   OB History   Grav Para Term Preterm Abortions TAB SAB Ect Mult Living   3 2 1 1      2       Past Medical History  Diagnosis Date  . Pancreatitis   . Preterm labor     Past Surgical History  Procedure Laterality Date  . Cesarean section      x2    Family History  Problem Relation Age of Onset  . Diabetes Maternal Grandfather   . Heart attack Father     History  Substance Use Topics  . Smoking status: Current Every Day Smoker -- 0.20 packs/day for 4 years    Types: Cigarettes  . Smokeless tobacco: Never Used  . Alcohol Use: No    Allergies: No Known Allergies  Facility-administered medications prior to admission  Medication Dose Route Frequency Provider Last Rate Last Dose  . hydroxyprogesterone caproate (DELALUTIN) 250 mg/mL injection 250 mg  250 mg Intramuscular Weekly Allie Bossier, MD   250 mg at 07/16/13 1017  . influenza vac split quadrivalent PF (FLUARIX) injection 0.5 mL  0.5 mL Intramuscular Once Lesly Dukes, MD       Prescriptions prior to admission  Medication Sig Dispense Refill  . Prenatal Vit-Fe Fumarate-FA (PRENATAL MULTIVITAMIN) TABS tablet Take 1 tablet by mouth daily at 12 noon.       Results for orders placed during the hospital encounter of 07/21/13 (from the past 48 hour(s))  AMNISURE RUPTURE OF MEMBRANE (ROM)     Status: None   Collection Time    07/21/13 11:55 AM      Result Value Ref  Range   Amnisure ROM NEGATIVE      Review of Systems  Constitutional: Negative for fever and chills.  Gastrointestinal: Positive for abdominal pain (Bilateral abdominal cramping).  Genitourinary: Negative for dysuria, urgency, frequency and hematuria.       No vaginal discharge. No vaginal bleeding. No dysuria.    Physical Exam   Blood pressure 124/70, pulse 80, temperature 98.7 F (37.1 C), temperature source Oral, resp. rate 16, height 5\' 3"  (1.6 m), weight 99.338 kg (219 lb), last menstrual period 03/02/2013, SpO2 100.00%.  Physical Exam  Constitutional: She is oriented to person, place, and time. She appears well-developed and well-nourished. No distress.  HENT:  Head: Normocephalic.  Eyes: Pupils are equal, round, and reactive to light.  Neck: Neck supple.  Respiratory: Effort normal.  Genitourinary:  Speculum exam: Vagina - Small amount of creamy discharge, no odor, no pooling of fluid Cervix - No contact bleeding Bimanual exam: Cervix closed Uterus non tender, normal size Adnexa non tender, no masses bilaterally Amnisure collected  Chaperone present for exam.   Musculoskeletal: Normal range of motion.  Neurological: She is alert and oriented to person, place, and time.  Skin: Skin is warm. She is not diaphoretic.  Psychiatric:  Her behavior is normal.  Pt is tearful and anxious     MAU Course  Procedures None  MDM + fetal heart tones in MAU 152 bpm Amnisure negative  Dr. Erin FullingHarraway-Smith spoke to MFM and is aware that patient will come to MAU for amnisure and follow up with MFM as scheduled.  Pt is scheduled next Tuesday (1 week) for follow up US with MFM.  Patient requests to speak to Dr. Erin FullingHarraway-Smith.   Assessment and Plan   A:  1. Supervision of high risk pregnancy in first trimester   2. Anhydramnios in second trimester   3.  Amnisure negative for rupture of membranes   P:  Discharge home in stable condition Return to MFM next Tuesday for  US Preterm labor precautions dicussed Return with any Fever, flu like symptoms  Support given   Ruth HansenJennifer Irene Shavon Ashmore, NP  07/21/2013, 3:04 PM

## 2013-07-21 NOTE — MAU Provider Note (Signed)
Pt was seen by me.  I reviewed the pts info with Dr. Claudean SeveranceWhitecar prior to her arrival in the MAU. She was sent to the MAU for r/o ROM.  Her amnisure was negative She does reports a h/o leaking fluid for 3 days last month.  She denies recent leakage.  I reviewed with her that the fetus is currently nonviable and that per Dr. Claudean SeveranceWhitecar she has a f/u visit in 1 week to recheck fluid.  I advised her to refrain for sexual intercourse and decrease physical activity. She is to increase her po fluid intake. I answered her and her family's questions and they expressed that they had no further questions.  Skippy Marhefka L. Harraway-Smith, M.D., Evern CoreFACOG

## 2013-07-22 ENCOUNTER — Ambulatory Visit (HOSPITAL_COMMUNITY): Payer: Medicaid Other

## 2013-07-22 ENCOUNTER — Encounter: Payer: Self-pay | Admitting: Advanced Practice Midwife

## 2013-07-22 ENCOUNTER — Ambulatory Visit (INDEPENDENT_AMBULATORY_CARE_PROVIDER_SITE_OTHER): Payer: Medicaid Other | Admitting: Obstetrics & Gynecology

## 2013-07-22 VITALS — BP 138/82 | Temp 98.9°F | Wt 218.0 lb

## 2013-07-22 DIAGNOSIS — O4102X0 Oligohydramnios, second trimester, not applicable or unspecified: Secondary | ICD-10-CM | POA: Insufficient documentation

## 2013-07-22 DIAGNOSIS — O4100X0 Oligohydramnios, unspecified trimester, not applicable or unspecified: Secondary | ICD-10-CM

## 2013-07-22 NOTE — Progress Notes (Signed)
P-102 

## 2013-07-22 NOTE — Progress Notes (Signed)
Patient presents for f/u after anhydramnios found on anatomy US.  Pt had c/o LOF at 16 weeks but was ruled out for rupture.  She has not had continued leaking.  Pt had negative amniosure yesterday.  Pt's care planned reviewed by MFM and MAU provider yesterday.  Plan reconfirmed today.  Pt does not want to terminate.  Will monitor for signs of infection and rupture.   Pt to take temp 2x per day.  Nothing per vagina.  Pt to be seen weekly in our office and US by MFM.  Will admit at 23 weeks as per MFM note.  Pt had cramping and sterile speculum done today.  No fluid in vault and fern negative.  Will continue 17P

## 2013-07-23 ENCOUNTER — Inpatient Hospital Stay (HOSPITAL_COMMUNITY)
Admission: AD | Admit: 2013-07-23 | Discharge: 2013-07-23 | Disposition: A | Discharge status: Home / Self Care | Payer: Medicaid Other | Source: Ambulatory Visit | Attending: Obstetrics & Gynecology | Admitting: Obstetrics & Gynecology

## 2013-07-23 ENCOUNTER — Telehealth: Payer: Self-pay | Admitting: *Deleted

## 2013-07-23 ENCOUNTER — Ambulatory Visit (INDEPENDENT_AMBULATORY_CARE_PROVIDER_SITE_OTHER): Payer: Medicaid Other | Admitting: *Deleted

## 2013-07-23 ENCOUNTER — Encounter (HOSPITAL_COMMUNITY): Payer: Self-pay | Admitting: *Deleted

## 2013-07-23 ENCOUNTER — Ambulatory Visit: Payer: Medicaid Other

## 2013-07-23 DIAGNOSIS — O9989 Other specified diseases and conditions complicating pregnancy, childbirth and the puerperium: Principal | ICD-10-CM

## 2013-07-23 DIAGNOSIS — O99891 Other specified diseases and conditions complicating pregnancy: Secondary | ICD-10-CM | POA: Insufficient documentation

## 2013-07-23 DIAGNOSIS — O4102X0 Oligohydramnios, second trimester, not applicable or unspecified: Secondary | ICD-10-CM

## 2013-07-23 DIAGNOSIS — Z87891 Personal history of nicotine dependence: Secondary | ICD-10-CM | POA: Insufficient documentation

## 2013-07-23 DIAGNOSIS — O30009 Twin pregnancy, unspecified number of placenta and unspecified number of amniotic sacs, unspecified trimester: Secondary | ICD-10-CM | POA: Insufficient documentation

## 2013-07-23 DIAGNOSIS — O26899 Other specified pregnancy related conditions, unspecified trimester: Secondary | ICD-10-CM

## 2013-07-23 DIAGNOSIS — O4100X0 Oligohydramnios, unspecified trimester, not applicable or unspecified: Secondary | ICD-10-CM | POA: Insufficient documentation

## 2013-07-23 DIAGNOSIS — IMO0002 Reserved for concepts with insufficient information to code with codable children: Secondary | ICD-10-CM | POA: Insufficient documentation

## 2013-07-23 DIAGNOSIS — R109 Unspecified abdominal pain: Secondary | ICD-10-CM | POA: Insufficient documentation

## 2013-07-23 DIAGNOSIS — O09219 Supervision of pregnancy with history of pre-term labor, unspecified trimester: Secondary | ICD-10-CM

## 2013-07-23 LAB — URINALYSIS, ROUTINE W REFLEX MICROSCOPIC
Bilirubin Urine: NEGATIVE
Glucose, UA: NEGATIVE mg/dL
Hgb urine dipstick: NEGATIVE
KETONES UR: NEGATIVE mg/dL
LEUKOCYTES UA: NEGATIVE
Nitrite: NEGATIVE
PROTEIN: NEGATIVE mg/dL
Specific Gravity, Urine: <1.005 — ABNORMAL LOW (ref 1.005–1.030)
UROBILINOGEN UA: 0.2 mg/dL (ref 0.0–1.0)
pH: 7 (ref 5.0–8.0)

## 2013-07-23 NOTE — MAU Provider Note (Signed)
History     CSN: [id number]  Arrival date and time: 07/23/13 7829   First Provider Initiated Contact with Patient 07/23/13 [phone]      Chief Complaint  Patient presents with  . Labor Eval   HPI 28 yo [id number] @ [redacted]w[redacted]d presents with 1 day of severe cramping this AM which lasted 3 hours. She had twin gestation with single fetal demise approximately at 7 WGA. She had vaginal fluid loss described as "gushing" during her gestation. She was seen by MFM on 3.2.15 and U/S showed anhydramnios, and she was seen in clinic but found to have negative amniosure, no fluid in vault, and negative fern test.   Past Medical History  Diagnosis Date  . Pancreatitis   . Preterm labor     Past Surgical History  Procedure Laterality Date  . Cesarean section      x2    Family History  Problem Relation Age of Onset  . Diabetes Maternal Grandfather   . Heart attack Father     History  Substance Use Topics  . Smoking status: Former Smoker -- 0.20 packs/day for 4 years    Types: Cigarettes  . Smokeless tobacco: Never Used  . Alcohol Use: No    Allergies: No Known Allergies  Facility-administered medications prior to admission  Medication Dose Route Frequency Provider Last Rate Last Dose  . hydroxyprogesterone caproate (DELALUTIN) 250 mg/mL injection 250 mg  250 mg Intramuscular Weekly Allie Bossier, MD   250 mg at 07/16/13 1017  . [DISCONTINUED] influenza vac split quadrivalent PF (FLUARIX) injection 0.5 mL  0.5 mL Intramuscular Once Lesly Dukes, MD       Prescriptions prior to admission  Medication Sig Dispense Refill  . acetaminophen (TYLENOL) 325 MG tablet Take 650 mg by mouth every 6 (six) hours as needed for headache.      . calcium carbonate (TUMS - DOSED IN MG ELEMENTAL CALCIUM) 500 MG chewable tablet Chew 2 tablets by mouth 2 (two) times daily as needed for indigestion or heartburn.      . Prenatal Vit-Fe Fumarate-FA (PRENATAL MULTIVITAMIN) TABS tablet Take 1 tablet by mouth daily at 12  noon.      . hydroxyprogesterone caproate (DELALUTIN) 250 mg/mL OIL injection Inject 250 mg into the muscle every 7 (seven) days. On Wednesdays        Review of Systems  Constitutional: Negative for fever and chills.  HENT: Negative for hearing loss.   Eyes: Negative for blurred vision and double vision.  Genitourinary: Negative for dysuria and urgency.  Neurological: Negative for headaches.   Physical Exam   Blood pressure 124/65, pulse 102, resp. rate 18, last menstrual period 03/02/2013.  Physical Exam  Constitutional: She appears well-developed and well-nourished. She appears distressed (tearful).  HENT:  Head: Atraumatic.  GI: She exhibits no distension. There is no tenderness.  Genitourinary: Vagina normal and uterus normal. No vaginal discharge found.    MAU Course  Procedures Dilation: Closed Effacement (%): Thick Station:  (high) Exam by:: L Leftwich-Kirby CNM  FHT 140 by doppler  Assessment and Plan  #Abdominal cramping - resolved before provider in room  #Anhydramnios - plan in place with MFM and MAU, no changes.   Michaelene Song 07/23/2013, 11:48 AM   I have seen this patient and agree with the above resident's note.  Pt pain resolved prior to evaluation in MAU. Plan for pt to receive weekly 17-P in office and BMZ on hospital admission at 23 weeks. Pt to  return to hospital if signs of PTL.  LEFTWICH-KIRBY, Briarrose Shor Certified Nurse-Midwife

## 2013-07-23 NOTE — Discharge Instructions (Signed)
Abdominal Pain During Pregnancy °Abdominal pain is common in pregnancy. Most of the time, it does not cause harm. There are many causes of abdominal pain. Some causes are more serious than others. Some of the causes of abdominal pain in pregnancy are easily diagnosed. Occasionally, the diagnosis takes time to understand. Other times, the cause is not determined. Abdominal pain can be a sign that something is very wrong with the pregnancy, or the pain may have nothing to do with the pregnancy at all. For this reason, always tell your health care provider if you have any abdominal discomfort. °HOME CARE INSTRUCTIONS  °Monitor your abdominal pain for any changes. The following actions may help to alleviate any discomfort you are experiencing: °· Do not have sexual intercourse or put anything in your vagina until your symptoms go away completely. °· Get plenty of rest until your pain improves. °· Drink clear fluids if you feel nauseous. Avoid solid food as long as you are uncomfortable or nauseous. °· Only take over-the-counter or prescription medicine as directed by your health care provider. °· Keep all follow-up appointments with your health care provider. °SEEK IMMEDIATE MEDICAL CARE IF: °· You are bleeding, leaking fluid, or passing tissue from the vagina. °· You have increasing pain or cramping. °· You have persistent vomiting. °· You have painful or bloody urination. °· You have a fever. °· You notice a decrease in your baby's movements. °· You have extreme weakness or feel faint. °· You have shortness of breath, with or without abdominal pain. °· You develop a severe headache with abdominal pain. °· You have abnormal vaginal discharge with abdominal pain. °· You have persistent diarrhea. °· You have abdominal pain that continues even after rest, or gets worse. °MAKE SURE YOU:  °· Understand these instructions. °· Will watch your condition. °· Will get help right away if you are not doing well or get  worse. °Document Released: 05/08/2005 Document Revised: 02/26/2013 Document Reviewed: 12/05/2012 °[employer]® Patient Information ©2014 [employer]. ° °

## 2013-07-23 NOTE — Telephone Encounter (Signed)
Pt missed her 17-P while at the hospital today and I called her to come into our office to receive it today.

## 2013-07-23 NOTE — MAU Note (Signed)
C/o strong ucs since 0430 this AM;

## 2013-07-23 NOTE — MAU Note (Signed)
Pt was told that she has no amniotic fluid around the baby; she was instructed to come to the hospital when labor started; FHR was 140; pain was 5 earlier this AM but denies any pain now;

## 2013-07-29 ENCOUNTER — Ambulatory Visit (HOSPITAL_COMMUNITY)
Admit: 2013-07-29 | Discharge: 2013-07-29 | Disposition: A | Discharge status: Home / Self Care | Payer: Medicaid Other | Attending: Obstetrics & Gynecology | Admitting: Obstetrics & Gynecology

## 2013-07-29 ENCOUNTER — Encounter: Payer: Medicaid Other | Admitting: Obstetrics & Gynecology

## 2013-07-29 DIAGNOSIS — O4100X0 Oligohydramnios, unspecified trimester, not applicable or unspecified: Secondary | ICD-10-CM | POA: Insufficient documentation

## 2013-07-29 DIAGNOSIS — O09299 Supervision of pregnancy with other poor reproductive or obstetric history, unspecified trimester: Secondary | ICD-10-CM

## 2013-07-29 DIAGNOSIS — Z1389 Encounter for screening for other disorder: Secondary | ICD-10-CM | POA: Insufficient documentation

## 2013-07-29 DIAGNOSIS — O34219 Maternal care for unspecified type scar from previous cesarean delivery: Secondary | ICD-10-CM | POA: Insufficient documentation

## 2013-07-29 DIAGNOSIS — Z363 Encounter for antenatal screening for malformations: Secondary | ICD-10-CM | POA: Insufficient documentation

## 2013-07-29 DIAGNOSIS — Z8751 Personal history of pre-term labor: Secondary | ICD-10-CM | POA: Insufficient documentation

## 2013-07-29 DIAGNOSIS — IMO0002 Reserved for concepts with insufficient information to code with codable children: Secondary | ICD-10-CM

## 2013-07-30 ENCOUNTER — Ambulatory Visit (INDEPENDENT_AMBULATORY_CARE_PROVIDER_SITE_OTHER): Payer: Medicaid Other | Admitting: *Deleted

## 2013-07-30 ENCOUNTER — Encounter: Payer: Medicaid Other | Admitting: Obstetrics & Gynecology

## 2013-07-30 DIAGNOSIS — O09211 Supervision of pregnancy with history of pre-term labor, first trimester: Secondary | ICD-10-CM

## 2013-07-30 DIAGNOSIS — O09219 Supervision of pregnancy with history of pre-term labor, unspecified trimester: Secondary | ICD-10-CM

## 2013-08-04 ENCOUNTER — Encounter: Payer: Self-pay | Admitting: Obstetrics & Gynecology

## 2013-08-04 ENCOUNTER — Encounter: Payer: Medicaid Other | Admitting: Advanced Practice Midwife

## 2013-08-05 ENCOUNTER — Ambulatory Visit (HOSPITAL_COMMUNITY)
Admit: 2013-08-05 | Discharge: 2013-08-05 | Disposition: A | Discharge status: Home / Self Care | Payer: Medicaid Other | Attending: Obstetrics & Gynecology | Admitting: Obstetrics & Gynecology

## 2013-08-05 ENCOUNTER — Other Ambulatory Visit: Payer: Self-pay | Admitting: Obstetrics & Gynecology

## 2013-08-05 ENCOUNTER — Ambulatory Visit (HOSPITAL_COMMUNITY): Payer: Medicaid Other

## 2013-08-05 DIAGNOSIS — O09299 Supervision of pregnancy with other poor reproductive or obstetric history, unspecified trimester: Secondary | ICD-10-CM | POA: Insufficient documentation

## 2013-08-05 DIAGNOSIS — O208 Other hemorrhage in early pregnancy: Secondary | ICD-10-CM | POA: Insufficient documentation

## 2013-08-05 DIAGNOSIS — O34219 Maternal care for unspecified type scar from previous cesarean delivery: Secondary | ICD-10-CM | POA: Insufficient documentation

## 2013-08-05 DIAGNOSIS — O4100X0 Oligohydramnios, unspecified trimester, not applicable or unspecified: Secondary | ICD-10-CM

## 2013-08-05 DIAGNOSIS — Z8751 Personal history of pre-term labor: Secondary | ICD-10-CM | POA: Insufficient documentation

## 2013-08-05 DIAGNOSIS — Z363 Encounter for antenatal screening for malformations: Secondary | ICD-10-CM | POA: Insufficient documentation

## 2013-08-05 DIAGNOSIS — Z1389 Encounter for screening for other disorder: Secondary | ICD-10-CM | POA: Insufficient documentation

## 2013-08-05 DIAGNOSIS — IMO0002 Reserved for concepts with insufficient information to code with codable children: Secondary | ICD-10-CM | POA: Insufficient documentation

## 2013-08-05 NOTE — Progress Notes (Signed)
[facility]  Indication: 28 yr old [id number] at 2752w1d with likely second trimester PPROM for fetal ultrasound.  Findings: 1. Single intrauterine pregnancy. 2. Anterior placenta. 3. Oligohydramnios with AFI of 2cm. 4. The limited anatomy survey is normal. Views of the spine and abdominal cord insertion remain limited. 5. Normal kidneys, stomach, and bladder are seen.  Recommendations: 1. Previable PPROM: - previously counseled - diagnosis has not been confirmed but patient reports continuous leaking of fluid and in the setting of normal kidneys and bladder this is the most likely diagnosis - patient has been offered the amnio dye test and declines- recommend manage as PPROM - recommend admission to the hospital around viability- when patient chooses with administration of latency antibiotics and betamethasone at that time - recommend temperatures 2x/day and weekly prenatal visits; patient counseled to call with any low grade fever (100 degrees or greater) or tenderness or contractions - recommend delivery at 34 weeks or sooner for labor, infection, or nonreassuring fetal status (once viable) - recommend NICU consult - patient has been counseled on concerns for pulmonary hypoplasia 2. Normal limited anatomy survey 3. Fetal growth every 4 weeks 4. Previous preterm delivery: - is on 17OH progesterone - previously counseled  Eulis FosterKristen Mazell Aylesworth, MD

## 2013-08-06 ENCOUNTER — Ambulatory Visit (INDEPENDENT_AMBULATORY_CARE_PROVIDER_SITE_OTHER): Payer: Medicaid Other | Admitting: Obstetrics & Gynecology

## 2013-08-06 ENCOUNTER — Encounter: Payer: Self-pay | Admitting: Obstetrics & Gynecology

## 2013-08-06 VITALS — BP 123/76 | Temp 97.8°F | Wt 223.0 lb

## 2013-08-06 DIAGNOSIS — O09219 Supervision of pregnancy with history of pre-term labor, unspecified trimester: Secondary | ICD-10-CM

## 2013-08-06 DIAGNOSIS — O0990 Supervision of high risk pregnancy, unspecified, unspecified trimester: Secondary | ICD-10-CM

## 2013-08-06 DIAGNOSIS — O0992 Supervision of high risk pregnancy, unspecified, second trimester: Secondary | ICD-10-CM

## 2013-08-06 LAB — GLUCOSE, POCT (MANUAL RESULT ENTRY): POC Glucose: 78 mg/dl (ref 70–99)

## 2013-08-06 NOTE — Progress Notes (Signed)
p-92  Feels like she is leaking more today

## 2013-08-06 NOTE — Progress Notes (Signed)
Nitirizine and fern negative.  Neg pool.  Pt taking temp x2 day.  No cramping or bleeding.  Reviewed MFM note.  Pt leaning towards not being admitted at 24 weeks.  She would rather do home antibiotics.  Pt understands this plan is against medical advise, but is she is willing to meet as partial way, this would be better than no treatment.  Pt agrees to betamethasone. Continue taking temps.  CBG today (pt is juicing a lot).

## 2013-08-07 ENCOUNTER — Other Ambulatory Visit: Payer: Self-pay | Admitting: Obstetrics & Gynecology

## 2013-08-07 DIAGNOSIS — Z8751 Personal history of pre-term labor: Secondary | ICD-10-CM

## 2013-08-07 DIAGNOSIS — IMO0002 Reserved for concepts with insufficient information to code with codable children: Secondary | ICD-10-CM

## 2013-08-07 DIAGNOSIS — O3421 Maternal care for scar from previous cesarean delivery: Secondary | ICD-10-CM

## 2013-08-07 DIAGNOSIS — Z1389 Encounter for screening for other disorder: Secondary | ICD-10-CM

## 2013-08-07 DIAGNOSIS — O4100X0 Oligohydramnios, unspecified trimester, not applicable or unspecified: Secondary | ICD-10-CM

## 2013-08-12 ENCOUNTER — Ambulatory Visit (HOSPITAL_COMMUNITY): Payer: Medicaid Other

## 2013-08-13 ENCOUNTER — Ambulatory Visit: Payer: Medicaid Other

## 2013-08-13 ENCOUNTER — Telehealth: Payer: Self-pay | Admitting: *Deleted

## 2013-08-13 NOTE — Telephone Encounter (Signed)
Pt called stating that she was currently inpatient @ [facility] .  She states that she started bleeding on Monday night and was scared and went to the closest hospital.  Per patient she was told @ the hospital she had a placental abruption.  She has has had discussion with [facility] about the possibility her baby being premature.  Per pt she was given the 17-P today while in hospital.  I discuss the fact that whether pt will be followed by us or [facility].  Pt did go to the nearest hospital in an emergency but explained that our Dr's do not have [facility] and she should be followed where she could get continuity of care.  She is going to discuss with her husband and let us know whether she will continue here or choose to be [facility].

## 2013-08-13 NOTE — Telephone Encounter (Signed)
Pt called stating that [facility] was [facility].  She and her husband have decided to [facility] and she [place]  A copy of her medical records are available for her to come by and pick up here.  She was very appreciative of the services that we provided for her but [facility] is closer to her home and only 5 minutes from where her husband works.  She promises to keeps us in the loop with her progress.

## 2013-08-20 ENCOUNTER — Encounter: Payer: Medicaid Other | Admitting: Obstetrics & Gynecology

## 2013-09-05 DIAGNOSIS — O42919 Preterm premature rupture of membranes, unspecified as to length of time between rupture and onset of labor, unspecified trimester: Secondary | ICD-10-CM

## 2014-03-09 ENCOUNTER — Encounter (HOSPITAL_COMMUNITY): Payer: Self-pay | Admitting: *Deleted

## 2014-03-23 ENCOUNTER — Encounter (HOSPITAL_COMMUNITY): Payer: Self-pay | Admitting: *Deleted

## 2014-09-21 ENCOUNTER — Ambulatory Visit (INDEPENDENT_AMBULATORY_CARE_PROVIDER_SITE_OTHER): Payer: Commercial Managed Care - PPO | Admitting: Obstetrics & Gynecology

## 2014-09-21 ENCOUNTER — Encounter: Payer: Self-pay | Admitting: Obstetrics & Gynecology

## 2014-09-21 VITALS — BP 124/77 | HR 71 | Wt 224.0 lb

## 2014-09-21 DIAGNOSIS — O09291 Supervision of pregnancy with other poor reproductive or obstetric history, first trimester: Secondary | ICD-10-CM

## 2014-09-21 DIAGNOSIS — Z3491 Encounter for supervision of normal pregnancy, unspecified, first trimester: Secondary | ICD-10-CM | POA: Diagnosis not present

## 2014-09-21 DIAGNOSIS — O34219 Maternal care for unspecified type scar from previous cesarean delivery: Secondary | ICD-10-CM | POA: Insufficient documentation

## 2014-09-21 DIAGNOSIS — O0991 Supervision of high risk pregnancy, unspecified, first trimester: Secondary | ICD-10-CM

## 2014-09-21 DIAGNOSIS — O3421 Maternal care for scar from previous cesarean delivery: Secondary | ICD-10-CM

## 2014-09-21 NOTE — Progress Notes (Signed)
Last Pap 04/2013 - Normal. Bedside US shows single IUP measuring 8566w0d with CRL 9.233mm FHR 131

## 2014-09-21 NOTE — Progress Notes (Signed)
   Subjective:    Ruth Duran is a [id number] 683w0d being seen today for her first obstetrical visit.  Her obstetrical history is significant for PPROM with last pregnancy resulting in neonatal death at 25 weeks, abruption with second pregnancy, smoker. Patient does intend to breast feed. Pregnancy history fully reviewed.  Patient reports mild fatigue and nausea.  Filed Vitals:   09/21/14 1424  BP: 124/77  Pulse: 71  Weight: 224 lb (101.606 kg)    HISTORY: OB History  Gravida Para Term Preterm AB SAB TAB Ectopic Multiple Living  4 3 1 2      2     # Outcome Date GA Lbr Len/2nd Weight Sex Delivery Anes PTL Lv  4 Current           3 Preterm 09/05/13 824w4d  2 lb 2 oz (0.964 kg) M Vag-Spont  Y FD     Complications: Abruptio Placenta  2 Preterm 2009 1592w0d  5 lb 2 oz (2.325 kg) M CS-LTranv EPI Y Y     Comments: PPROM  1 Term 2007 6623w0d  6 lb 7 oz (2.92 kg)  CS-LTranv EPI N Y     Comments: abruption at term--c/s     Past Medical History  Diagnosis Date  . Pancreatitis   . Preterm labor    Past Surgical History  Procedure Laterality Date  . Cesarean section      x2   Family History  Problem Relation Age of Onset  . Diabetes Maternal Grandfather   . Heart attack Father      Exam    Uterus:     Pelvic Exam:    Perineum: No Hemorrhoids   Vulva: normal   Vagina:  normal mucosa, normal discharge   pH: N/A   Cervix: no lesions   Adnexa: normal adnexa   Bony Pelvis: average  System: Breast:  normal appearance, no masses or tenderness   Skin: normal coloration and turgor, no rashes    Neurologic: negative, normal mood   Extremities: normal strength, tone, and muscle mass   HEENT sclera clear, anicteric   Mouth/Teeth mucous membranes moist, pharynx normal without lesions and flossing encouraged   Neck supple and no masses   Cardiovascular: regular rate and rhythm   Respiratory:  appears well, vitals normal, no respiratory distress, acyanotic, normal RR, neck free of mass  or lymphadenopathy, chest clear, no wheezing, crepitations, rhonchi, normal symmetric air entry   Abdomen: soft, non-tender; bowel sounds normal; no masses,  no organomegaly   Urinary: urethral meatus normal      Assessment:    Pregnancy: [id number] History of abruption at 36 weeks  History of 2nd trimester PPROM resulting in 25 week neonatal demise shortly after birth Smoking History of 3 cesarean sections   Plan:     Initial labs drawn. Prenatal vitamins. Problem list reviewed and updated. Genetic Screening discussed.  Patient declines genetic testing  Ultrasound discussed; fetal survey: 18 weeks with MFM Patient to have MFM consult.  They are familiar with her case as [facility].   Smoking Cessation 17P at 16 weeks   Ruth Duran H. 09/21/2014

## 2014-09-22 ENCOUNTER — Encounter (HOSPITAL_COMMUNITY): Payer: Commercial Managed Care - PPO

## 2014-09-22 ENCOUNTER — Encounter: Payer: Self-pay | Admitting: Obstetrics & Gynecology

## 2014-09-22 LAB — PRENATAL PROFILE (SOLSTAS)
Antibody Screen: NEGATIVE
BASOS ABS: 0 10*3/uL (ref 0.0–0.1)
Basophils Relative: 0 % (ref 0–1)
EOS ABS: 0.2 10*3/uL (ref 0.0–0.7)
Eosinophils Relative: 2 % (ref 0–5)
HCT: 38.2 % (ref 36.0–46.0)
HIV 1&2 Ab, 4th Generation: NONREACTIVE
Hemoglobin: 12.8 g/dL (ref 12.0–15.0)
Hepatitis B Surface Ag: NEGATIVE
Lymphocytes Relative: 33 % (ref 12–46)
Lymphs Abs: 3.1 10*3/uL (ref 0.7–4.0)
MCH: 28.3 pg (ref 26.0–34.0)
MCHC: 33.5 g/dL (ref 30.0–36.0)
MCV: 84.5 fL (ref 78.0–100.0)
MONO ABS: 0.5 10*3/uL (ref 0.1–1.0)
MPV: 8.8 fL (ref 8.6–12.4)
Monocytes Relative: 5 % (ref 3–12)
Neutro Abs: 5.6 10*3/uL (ref 1.7–7.7)
Neutrophils Relative %: 60 % (ref 43–77)
Platelets: 277 10*3/uL (ref 150–400)
RBC: 4.52 MIL/uL (ref 3.87–5.11)
RDW: 14.3 % (ref 11.5–15.5)
RUBELLA: 1.59 {index} — AB (ref <0.90)
Rh Type: POSITIVE
WBC: 9.4 10*3/uL (ref 4.0–10.5)

## 2014-09-23 LAB — CULTURE, OB URINE: Colony Count: 3000

## 2014-09-23 LAB — GC/CHLAMYDIA PROBE AMP
CT Probe RNA: NEGATIVE
GC Probe RNA: NEGATIVE

## 2014-10-06 ENCOUNTER — Encounter (HOSPITAL_COMMUNITY): Payer: Self-pay

## 2014-10-06 ENCOUNTER — Ambulatory Visit (HOSPITAL_COMMUNITY)
Admission: RE | Admit: 2014-10-06 | Discharge: 2014-10-06 | Disposition: A | Discharge status: Home / Self Care | Payer: Commercial Managed Care - PPO | Source: Ambulatory Visit | Attending: Obstetrics & Gynecology | Admitting: Obstetrics & Gynecology

## 2014-10-06 DIAGNOSIS — O3421 Maternal care for scar from previous cesarean delivery: Secondary | ICD-10-CM | POA: Diagnosis not present

## 2014-10-06 DIAGNOSIS — O09291 Supervision of pregnancy with other poor reproductive or obstetric history, first trimester: Secondary | ICD-10-CM | POA: Diagnosis present

## 2014-10-06 DIAGNOSIS — Z3A09 9 weeks gestation of pregnancy: Secondary | ICD-10-CM | POA: Insufficient documentation

## 2014-10-06 DIAGNOSIS — Z87891 Personal history of nicotine dependence: Secondary | ICD-10-CM | POA: Insufficient documentation

## 2014-10-06 NOTE — Consult Note (Signed)
Maternal Fetal Medicine Consultation  Requesting Provider(s): Ruth LincolnKelly Leggett, MD  Reason for consultation: Hx of placental abruption, second trimester PROM and delivery at 25 weeks with neonatal demise  HPI: Ruth Duran is a  29 yo [id number], EDD 05/10/2015 currently at 9w 1d who was seen for consultation due to a history of placental abruption at 39 weeks and second trimester PROM with subsequent delivery at 25 weeks associated with neonatal demise.  Her OB history is as follows:  1) 2007 -  Emergency C-section at 39 weeks, placental abruption 2) 2009 -  PROM at 36 weeks, repeat C-section 3) 2015 -  "disappearing twin syndrome", subchorionic hemorrhage - PROM at [redacted] weeks gestation.  [facility]. Emergency lower transverse C-section at 25 weeks (verified in operation report).    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    OB History: OB History    Gravida Para Term Preterm AB TAB SAB Ectopic Multiple Living   4 3 1 2      2       PMH:  Past Medical History  Diagnosis Date  . Pancreatitis   . Preterm labor     PSH:  Past Surgical History  Procedure Laterality Date  . Cesarean section      x2   Meds:  Current Outpatient Prescriptions on File Prior to Encounter  Medication Sig Dispense Refill  . Prenatal Vit-Fe Fumarate-FA (PRENATAL MULTIVITAMIN) TABS tablet Take 1 tablet by mouth daily at 12 noon.     No current facility-administered medications on file prior to encounter.   Allergies: No Known Allergies    FH:  Family History  Problem Relation Age of Onset  . Diabetes Maternal Grandfather   . Heart attack Father    Soc:  History   Social History  . Marital Status: Single    Spouse Name: N/A  . Number of Children: N/A  . Years of Education: N/A   Occupational History  . homemaker    Social History Main Topics  . Smoking status: Former Smoker --  0.20 packs/day for 4 years    Types: Cigarettes  . Smokeless tobacco: Never Used  . Alcohol Use: No  . Drug Use: No  . Sexual Activity:    Partners: Male   Other Topics Concern  . Not on file   Social History Narrative   Review of Systems: no vaginal bleeding or cramping/contractions, no LOF, no nausea/vomiting. All other systems reviewed and are negative.  PE:   Filed Vitals:   10/06/14 1401  BP: 108/74  Pulse: 101    A/P:  1) Single IUP at 9w 1d  2) Hx of placental abruption at term - given this history, the patient is at risk for recurrence.  Unfortunately, this is generally an acute process that cannot be predicted.  Since this occurred, the patient reports that she has quit smoking which may decrease her risk for recurrent abruption.  3) Hx of Preterm delivery at 36 weeks, second trimester PROM - the patient is a candidate for 17-P injections and would recommend beginning weekly injections at 16-[redacted] weeks gestation.  Based on the patients history of a subchorionic bleed and vanishing twin syndrome, I suspect that this clearly played a role in PROM during the second trimester.  We briefly discussed the risk of cervical insufficiency and the role that it may play in PROM and feel that  she would benefit from screening cervical length ultrasounds.  4) Hx of three prior C-sections - the patient expressed some desire to have a TOLAC.  Her 25 week C-section was via a lower transverse incision (Op note reviewed).  There is little data available to counsel patient's for a trial of labor after 3 prior uterine incisions - with lack of data to support this, I would recommend against a TOLAC at this point.  Recommendations: 1) 17-P injections beginning at 16-[redacted] weeks gestation 2) Ultrasounds for cervical length surveillance beginning at 16 weeks with follow up every 2 weeks if cervical length stable until at least [redacted] weeks gestation 3) Serial ultrasounds for growth given history of prior term  abruption 4) Would recommend antenatal testing if lagging growth is noted or based on the clinical scenario beginning at [redacted] weeks gestation.   Thank you for the opportunity to be a part of the [facility]. Please contact our office if we can be of further assistance.   I spent approximately 30 minutes with this patient with over 50% of time spent in face-to-face counseling.  Alpha GulaPaul Marche Hottenstein, MD Maternal Fetal Medicine

## 2014-10-26 ENCOUNTER — Ambulatory Visit (INDEPENDENT_AMBULATORY_CARE_PROVIDER_SITE_OTHER): Payer: Commercial Managed Care - PPO | Admitting: Advanced Practice Midwife

## 2014-10-26 VITALS — BP 124/56 | HR 86 | Wt 227.0 lb

## 2014-10-26 DIAGNOSIS — O09291 Supervision of pregnancy with other poor reproductive or obstetric history, first trimester: Secondary | ICD-10-CM

## 2014-10-26 DIAGNOSIS — O0991 Supervision of high risk pregnancy, unspecified, first trimester: Secondary | ICD-10-CM

## 2014-10-26 DIAGNOSIS — O3421 Maternal care for scar from previous cesarean delivery: Secondary | ICD-10-CM

## 2014-10-26 DIAGNOSIS — O34219 Maternal care for unspecified type scar from previous cesarean delivery: Secondary | ICD-10-CM

## 2014-10-26 NOTE — Progress Notes (Signed)
Subjective:   Ruth Duran is a 29 y.o. [id number] at 6832w0d being seen today for her obstetrical visit.  Patient reports no complaints and Good appetite now. .   Contractions: Contractions: Not present.   Vaginal Bleeding Vag. Bleeding: None.   Fetal Movement: Movement: Absent.   Denies contractions, vaginal bleeding or leaking of fluid.  Reports good fetal movement. Many questions about exercise in pregnancy. Motivated to exercise and eat well during pregnancy.   The following portions of the patient's history were reviewed and updated as appropriate: allergies, current medications, past family history, past medical history, past social history, past surgical history and problem list.   Objective:  BP 124/56 mmHg  Pulse 86  Wt 227 lb (102.967 kg)  LMP 07/28/2014 Fetal Heart Rate: Fetal Heart Rate (bpm): 164  Fundal Height:    Fetal Movement: Movement: Absent  Fetal Presentation:     Abdomen: Soft, gravid, appropriate for gestational age.  Pain/Pressure: Pain/Pressure: Absent     Vaginal: Vaginal Bleeding Vag. Bleeding: None   Discharge: Vag D/C Character: Thin  Cervix: Dilation:   Effacement:   Station:    Extremities: Edema: Edema: None   Urinalysis: Protein: Urine Protein: Negative Glucose: Urine Glucose: Negative No results found for this or any previous visit (from the past 24 hour(s)).   Assessment and Plan:   Pregnancy:  [id number] at 4232w0d  1. History of preterm premature rupture of membranes (PROM) in previous pregnancy, currently pregnant, first trimester Has all US's scheduled w/ MFM - US MFM OB Transvaginal; Future  2. Supervision of high risk pregnancy in first trimester   Preterm labor symptoms: vaginal bleeding, contractions and leaking of fluid reviewed in detail.  Fetal movement precautions reviewed.  [facility] and [facility] as scheduled 7/5. OK to exercise as long as no bleeding, normal CL. Avoid activities w/ risk of abd trauma. Start slowly  w/ walking, swimming.    Dorathy KinsmanVirginia Curry Dulski, CNM

## 2014-10-26 NOTE — Patient Instructions (Signed)
Zyrtec Claritin  Allergies Allergies may happen from anything your body is sensitive to. This may be food, medicines, pollens, chemicals, and nearly anything around you in everyday life that produces allergens. An allergen is anything that causes an allergy producing substance. Heredity is often a factor in causing these problems. This means you may have some of the same allergies as your parents. Food allergies happen in all age groups. Food allergies are some of the most severe and life threatening. Some common food allergies are cow's milk, seafood, eggs, nuts, wheat, and soybeans. SYMPTOMS   Swelling around the mouth.  An itchy red rash or hives.  Vomiting or diarrhea.  Difficulty breathing. SEVERE ALLERGIC REACTIONS ARE LIFE-THREATENING. This reaction is called anaphylaxis. It can cause the mouth and throat to swell and cause difficulty with breathing and swallowing. In severe reactions only a trace amount of food (for example, peanut oil in a salad) may cause death within seconds. Seasonal allergies occur in all age groups. These are seasonal because they usually occur during the same season every year. They may be a reaction to molds, grass pollens, or tree pollens. Other causes of problems are house dust mite allergens, pet dander, and mold spores. The symptoms often consist of nasal congestion, a runny itchy nose associated with sneezing, and tearing itchy eyes. There is often an associated itching of the mouth and ears. The problems happen when you come in contact with pollens and other allergens. Allergens are the particles in the air that the body reacts to with an allergic reaction. This causes you to release allergic antibodies. Through a chain of events, these eventually cause you to release histamine into the blood stream. Although it is meant to be protective to the body, it is this release that causes your discomfort. This is why you were given anti-histamines to feel better. If  you are unable to pinpoint the offending allergen, it may be determined by skin or blood testing. Allergies cannot be cured but can be controlled with medicine. Hay fever is a collection of all or some of the seasonal allergy problems. It may often be treated with simple over-the-counter medicine such as diphenhydramine. Take medicine as directed. Do not drink alcohol or drive while taking this medicine. Check with your caregiver or package insert for child dosages. If these medicines are not effective, there are many new medicines your caregiver can prescribe. Stronger medicine such as nasal spray, eye drops, and corticosteroids may be used if the first things you try do not work well. Other treatments such as immunotherapy or desensitizing injections can be used if all else fails. Follow up with your caregiver if problems continue. These seasonal allergies are usually not life threatening. They are generally more of a nuisance that can often be handled using medicine. HOME CARE INSTRUCTIONS   If unsure what causes a reaction, keep a diary of foods eaten and symptoms that follow. Avoid foods that cause reactions.  If hives or rash are present:  Take medicine as directed.  You may use an over-the-counter antihistamine (diphenhydramine) for hives and itching as needed.  Apply cold compresses (cloths) to the skin or take baths in cool water. Avoid hot baths or showers. Heat will make a rash and itching worse.  If you are severely allergic:  Following a treatment for a severe reaction, hospitalization is often required for closer follow-up.  Wear a medic-alert bracelet or necklace stating the allergy.  You and your family must learn how to give  adrenaline or use an anaphylaxis kit.  If you have had a severe reaction, always carry your anaphylaxis kit or EpiPen with you. Use this medicine as directed by your caregiver if a severe reaction is occurring. Failure to do so could have a fatal  outcome. SEEK MEDICAL CARE IF:  You suspect a food allergy. Symptoms generally happen within 30 minutes of eating a food.  Your symptoms have not gone away within 2 days or are getting worse.  You develop new symptoms.  You want to retest yourself or your child with a food or drink you think causes an allergic reaction. Never do this if an anaphylactic reaction to that food or drink has happened before. Only do this under the care of a caregiver. SEEK IMMEDIATE MEDICAL CARE IF:   You have difficulty breathing, are wheezing, or have a tight feeling in your chest or throat.  You have a swollen mouth, or you have hives, swelling, or itching all over your body.  You have had a severe reaction that has responded to your anaphylaxis kit or an EpiPen. These reactions may return when the medicine has worn off. These reactions should be considered life threatening. MAKE SURE YOU:   Understand these instructions.  Will watch your condition.  Will get help right away if you are not doing well or get worse. Document Released: 08/01/2002 Document Revised: 09/02/2012 Document Reviewed: 01/06/2008 [facility] Patient Information 2015 [place], [place]. This information is not intended to replace advice given to you by your health care provider. Make sure you discuss any questions you have with your health care provider.

## 2014-11-24 ENCOUNTER — Encounter (HOSPITAL_COMMUNITY): Payer: Self-pay

## 2014-11-24 ENCOUNTER — Ambulatory Visit (HOSPITAL_COMMUNITY)
Admission: RE | Admit: 2014-11-24 | Discharge: 2014-11-24 | Disposition: A | Discharge status: Home / Self Care | Payer: Commercial Managed Care - PPO | Source: Ambulatory Visit | Attending: Obstetrics & Gynecology | Admitting: Obstetrics & Gynecology

## 2014-11-24 DIAGNOSIS — O3421 Maternal care for scar from previous cesarean delivery: Secondary | ICD-10-CM | POA: Diagnosis not present

## 2014-11-24 DIAGNOSIS — O09212 Supervision of pregnancy with history of pre-term labor, second trimester: Secondary | ICD-10-CM | POA: Insufficient documentation

## 2014-11-24 DIAGNOSIS — Z3A16 16 weeks gestation of pregnancy: Secondary | ICD-10-CM | POA: Insufficient documentation

## 2014-11-24 DIAGNOSIS — O09291 Supervision of pregnancy with other poor reproductive or obstetric history, first trimester: Secondary | ICD-10-CM

## 2014-11-24 DIAGNOSIS — O3432 Maternal care for cervical incompetence, second trimester: Secondary | ICD-10-CM | POA: Insufficient documentation

## 2014-11-24 DIAGNOSIS — O09292 Supervision of pregnancy with other poor reproductive or obstetric history, second trimester: Secondary | ICD-10-CM | POA: Diagnosis not present

## 2014-11-24 DIAGNOSIS — O09299 Supervision of pregnancy with other poor reproductive or obstetric history, unspecified trimester: Secondary | ICD-10-CM | POA: Insufficient documentation

## 2014-11-25 ENCOUNTER — Other Ambulatory Visit (HOSPITAL_COMMUNITY): Payer: Self-pay | Admitting: Maternal and Fetal Medicine

## 2014-11-27 ENCOUNTER — Ambulatory Visit (INDEPENDENT_AMBULATORY_CARE_PROVIDER_SITE_OTHER): Payer: Commercial Managed Care - PPO | Admitting: Advanced Practice Midwife

## 2014-11-27 VITALS — BP 117/79 | HR 80 | Wt 236.0 lb

## 2014-11-27 DIAGNOSIS — O26812 Pregnancy related exhaustion and fatigue, second trimester: Secondary | ICD-10-CM | POA: Diagnosis not present

## 2014-11-27 DIAGNOSIS — E049 Nontoxic goiter, unspecified: Secondary | ICD-10-CM | POA: Diagnosis not present

## 2014-11-27 DIAGNOSIS — E010 Iodine-deficiency related diffuse (endemic) goiter: Secondary | ICD-10-CM

## 2014-11-27 DIAGNOSIS — O26811 Pregnancy related exhaustion and fatigue, first trimester: Secondary | ICD-10-CM

## 2014-11-27 DIAGNOSIS — Z8751 Personal history of pre-term labor: Secondary | ICD-10-CM

## 2014-11-27 LAB — TSH: TSH: 1.553 u[IU]/mL (ref 0.350–4.500)

## 2014-11-27 MED ORDER — HYDROXYPROGESTERONE CAPROATE 250 MG/ML IM OIL
250.0000 mg | TOPICAL_OIL | INTRAMUSCULAR | Status: AC
  Administered 2014-11-27 – 2015-04-12 (×20): 250 mg via INTRAMUSCULAR

## 2014-11-27 NOTE — Patient Instructions (Signed)

## 2014-11-27 NOTE — Progress Notes (Signed)
CL 3.3. Normal fluid US this week. Growth US in 2 weeks.  Subjective:  Ruth Duran is a 29 y.o. [id number] at 8083w4d being seen today for ongoing prenatal care.  Patient reports no bleeding, no contractions, no cramping and no leaking.  Contractions: Not present.  Vag. Bleeding: None. Movement: Present. Denies leaking of fluid. Reports considerable fatigue past few months. Has never felt this in the past. No personal or FH thyroid Dz. Denies depression, dizziness, tachycardia.   Asking about safety of exercise in pregnancy. Concerned about large weight gain in past two years and anticipated weight gain this pregnancy. Has started walking  ~3 x per week.   The following portions of the patient's history were reviewed and updated as appropriate: allergies, current medications, past family history, past medical history, past social history, past surgical history and problem list.   Objective:   Filed Vitals:   11/27/14 1027  BP: 117/79  Pulse: 80  Weight: 236 lb (107.049 kg)    Fetal Status: Fetal Heart Rate (bpm): 163   Movement: Present     General:  Alert, oriented and cooperative. Patient is in no acute distress.  Skin: Skin is warm and dry. No rash noted.   Cardiovascular: Normal heart rate noted  Respiratory: Normal respiratory effort, no problems with respiration noted  Abdomen: Soft, gravid, appropriate for gestational age. Pain/Pressure: Absent     Vaginal: Vag. Bleeding: None.    Vag D/C Character: Thin  Cervix: Not evaluated        Extremities: Normal range of motion.  Edema: None  Mental Status: Normal mood and affect. Normal behavior. Normal judgment and thought content.                 Thyroid:  Mildly enlarged. No palpable nodules.   Urinalysis: Urine Protein: Negative Urine Glucose: Negative  Assessment and Plan:  Pregnancy: A5W0981G4P1202 at 283w4d  1. History of preterm delivery  - hydroxyprogesterone caproate (DELALUTIN) 250 mg/mL injection 250 mg; Inject 1 mL (250 mg  total) into the muscle once a week.   Preterm labor symptoms and general obstetric precautions including but not limited to vaginal bleeding, contractions, leaking of fluid and fetal movement were reviewed in detail with the patient.  Please refer to After Visit Summary for other counseling recommendations.   Return in about 1 week (around 12/04/2014) for 17-P.  Discussed safe exercise and weight gain in pregnancy. Applauded starting walking routine.  TSH  Dorathy KinsmanVirginia Amoni Scallan, CNM

## 2014-11-30 ENCOUNTER — Telehealth: Payer: Self-pay | Admitting: *Deleted

## 2014-11-30 ENCOUNTER — Telehealth: Payer: Self-pay

## 2014-11-30 NOTE — Telephone Encounter (Signed)
-----   Message from [place], [place] sent at 11/28/2014 10:44 AM EDT ----- Please inform pt of normal TSH.

## 2014-11-30 NOTE — Telephone Encounter (Signed)
Error

## 2014-12-01 NOTE — Telephone Encounter (Signed)
Left message for patient to return call to clinic regarding labs. Armandina StammerJennifer Howard RN BSN

## 2014-12-03 ENCOUNTER — Other Ambulatory Visit: Payer: Self-pay | Admitting: Advanced Practice Midwife

## 2014-12-03 DIAGNOSIS — Z3A22 22 weeks gestation of pregnancy: Secondary | ICD-10-CM

## 2014-12-03 DIAGNOSIS — O34219 Maternal care for unspecified type scar from previous cesarean delivery: Secondary | ICD-10-CM

## 2014-12-03 DIAGNOSIS — Z3A20 20 weeks gestation of pregnancy: Secondary | ICD-10-CM

## 2014-12-03 DIAGNOSIS — O09292 Supervision of pregnancy with other poor reproductive or obstetric history, second trimester: Secondary | ICD-10-CM

## 2014-12-03 DIAGNOSIS — O3432 Maternal care for cervical incompetence, second trimester: Secondary | ICD-10-CM

## 2014-12-03 DIAGNOSIS — Z3A18 18 weeks gestation of pregnancy: Secondary | ICD-10-CM

## 2014-12-03 DIAGNOSIS — Z3689 Encounter for other specified antenatal screening: Secondary | ICD-10-CM

## 2014-12-04 ENCOUNTER — Ambulatory Visit (INDEPENDENT_AMBULATORY_CARE_PROVIDER_SITE_OTHER): Payer: Commercial Managed Care - PPO | Admitting: *Deleted

## 2014-12-04 DIAGNOSIS — Z8751 Personal history of pre-term labor: Secondary | ICD-10-CM

## 2014-12-04 DIAGNOSIS — O09291 Supervision of pregnancy with other poor reproductive or obstetric history, first trimester: Secondary | ICD-10-CM

## 2014-12-08 ENCOUNTER — Encounter (HOSPITAL_COMMUNITY): Payer: Self-pay

## 2014-12-08 ENCOUNTER — Ambulatory Visit (HOSPITAL_COMMUNITY)
Admission: RE | Admit: 2014-12-08 | Discharge: 2014-12-08 | Disposition: A | Discharge status: Home / Self Care | Payer: Commercial Managed Care - PPO | Source: Ambulatory Visit | Attending: Advanced Practice Midwife | Admitting: Advanced Practice Midwife

## 2014-12-08 DIAGNOSIS — Z3A18 18 weeks gestation of pregnancy: Secondary | ICD-10-CM | POA: Diagnosis not present

## 2014-12-08 DIAGNOSIS — Z36 Encounter for antenatal screening of mother: Secondary | ICD-10-CM | POA: Diagnosis not present

## 2014-12-08 DIAGNOSIS — O34219 Maternal care for unspecified type scar from previous cesarean delivery: Secondary | ICD-10-CM | POA: Insufficient documentation

## 2014-12-08 DIAGNOSIS — O09292 Supervision of pregnancy with other poor reproductive or obstetric history, second trimester: Secondary | ICD-10-CM | POA: Insufficient documentation

## 2014-12-08 DIAGNOSIS — O3421 Maternal care for scar from previous cesarean delivery: Secondary | ICD-10-CM | POA: Diagnosis not present

## 2014-12-08 DIAGNOSIS — O3432 Maternal care for cervical incompetence, second trimester: Secondary | ICD-10-CM | POA: Insufficient documentation

## 2014-12-08 DIAGNOSIS — Z3689 Encounter for other specified antenatal screening: Secondary | ICD-10-CM | POA: Insufficient documentation

## 2014-12-11 ENCOUNTER — Ambulatory Visit (INDEPENDENT_AMBULATORY_CARE_PROVIDER_SITE_OTHER): Payer: Commercial Managed Care - PPO | Admitting: *Deleted

## 2014-12-11 DIAGNOSIS — Z8751 Personal history of pre-term labor: Secondary | ICD-10-CM | POA: Diagnosis not present

## 2014-12-18 ENCOUNTER — Ambulatory Visit (INDEPENDENT_AMBULATORY_CARE_PROVIDER_SITE_OTHER): Payer: Commercial Managed Care - PPO | Admitting: *Deleted

## 2014-12-18 DIAGNOSIS — Z8751 Personal history of pre-term labor: Secondary | ICD-10-CM | POA: Diagnosis not present

## 2014-12-22 ENCOUNTER — Ambulatory Visit (HOSPITAL_COMMUNITY)
Admission: RE | Admit: 2014-12-22 | Discharge: 2014-12-22 | Disposition: A | Discharge status: Home / Self Care | Payer: Commercial Managed Care - PPO | Source: Ambulatory Visit | Attending: Family | Admitting: Family

## 2014-12-22 ENCOUNTER — Encounter (HOSPITAL_COMMUNITY): Payer: Self-pay

## 2014-12-22 DIAGNOSIS — O3432 Maternal care for cervical incompetence, second trimester: Secondary | ICD-10-CM | POA: Diagnosis not present

## 2014-12-22 DIAGNOSIS — O09292 Supervision of pregnancy with other poor reproductive or obstetric history, second trimester: Secondary | ICD-10-CM | POA: Diagnosis present

## 2014-12-22 DIAGNOSIS — Z3A20 20 weeks gestation of pregnancy: Secondary | ICD-10-CM | POA: Diagnosis not present

## 2014-12-22 DIAGNOSIS — O34219 Maternal care for unspecified type scar from previous cesarean delivery: Secondary | ICD-10-CM

## 2014-12-22 DIAGNOSIS — O3421 Maternal care for scar from previous cesarean delivery: Secondary | ICD-10-CM | POA: Insufficient documentation

## 2014-12-25 ENCOUNTER — Ambulatory Visit (INDEPENDENT_AMBULATORY_CARE_PROVIDER_SITE_OTHER): Payer: Commercial Managed Care - PPO | Admitting: Family

## 2014-12-25 VITALS — BP 124/77 | HR 76 | Wt 241.0 lb

## 2014-12-25 DIAGNOSIS — O0991 Supervision of high risk pregnancy, unspecified, first trimester: Secondary | ICD-10-CM

## 2014-12-25 DIAGNOSIS — Z8751 Personal history of pre-term labor: Secondary | ICD-10-CM

## 2014-12-25 DIAGNOSIS — Z9889 Other specified postprocedural states: Secondary | ICD-10-CM

## 2014-12-25 DIAGNOSIS — Z98891 History of uterine scar from previous surgery: Secondary | ICD-10-CM | POA: Insufficient documentation

## 2014-12-25 DIAGNOSIS — Z8759 Personal history of other complications of pregnancy, childbirth and the puerperium: Secondary | ICD-10-CM

## 2014-12-25 DIAGNOSIS — O3432 Maternal care for cervical incompetence, second trimester: Secondary | ICD-10-CM

## 2014-12-25 NOTE — Progress Notes (Signed)
Subjective:  Ruth Duran is a 29 y.o. [id number] at [redacted]w[redacted]d being seen today for ongoing prenatal care.  Patient reports no complaints.  Contractions: Not present.  Vag. Bleeding: None. Movement: Present. Denies leaking of fluid.   The following portions of the patient's history were reviewed and updated as appropriate: allergies, current medications, past family history, past medical history, past social history, past surgical history and problem list.   Objective:   Filed Vitals:   12/25/14 1102  BP: 124/77  Pulse: 76  Weight: 241 lb (109.317 kg)    Fetal Status: Fetal Heart Rate (bpm): 141   Movement: Present     General:  Alert, oriented and cooperative. Patient is in no acute distress.  Skin: Skin is warm and dry. No rash noted.   Cardiovascular: Normal heart rate noted  Respiratory: Normal respiratory effort, no problems with respiration noted  Abdomen: Soft, gravid, appropriate for gestational age. Pain/Pressure: Absent     Vaginal: Vag. Bleeding: None.    Vag D/C Character: Thin  Cervix: Not evaluated        Extremities: Normal range of motion.  Edema: Trace  Mental Status: Normal mood and affect. Normal behavior. Normal judgment and thought content.   Urinalysis: Urine Protein: Negative Urine Glucose: Negative  Assessment and Plan:  Pregnancy: W4X3244 at [redacted]w[redacted]d  1. 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 Supervision of high risk pregnancy in first trimester - 17 p weekly  3. History of placenta abruption - monthly growth ultrasound  4. H/O: C-section - Repeat csection  Preterm labor symptoms and general obstetric precautions including but not limited to vaginal bleeding, contractions, leaking of fluid and fetal movement were reviewed in detail with the patient. Please refer to After Visit Summary for other counseling recommendations.  Return in about 4 weeks (around 01/22/2015).   Eino Farber Kennith Gain, CNM

## 2015-01-01 ENCOUNTER — Ambulatory Visit (INDEPENDENT_AMBULATORY_CARE_PROVIDER_SITE_OTHER): Payer: Commercial Managed Care - PPO | Admitting: *Deleted

## 2015-01-01 DIAGNOSIS — Z8751 Personal history of pre-term labor: Secondary | ICD-10-CM | POA: Diagnosis not present

## 2015-01-05 ENCOUNTER — Ambulatory Visit (HOSPITAL_COMMUNITY): Payer: Commercial Managed Care - PPO

## 2015-01-08 ENCOUNTER — Ambulatory Visit (INDEPENDENT_AMBULATORY_CARE_PROVIDER_SITE_OTHER): Payer: Commercial Managed Care - PPO | Admitting: *Deleted

## 2015-01-08 DIAGNOSIS — Z8751 Personal history of pre-term labor: Secondary | ICD-10-CM | POA: Diagnosis not present

## 2015-01-08 DIAGNOSIS — Z8759 Personal history of other complications of pregnancy, childbirth and the puerperium: Secondary | ICD-10-CM

## 2015-01-12 ENCOUNTER — Ambulatory Visit (HOSPITAL_COMMUNITY): Payer: Commercial Managed Care - PPO

## 2015-01-12 ENCOUNTER — Other Ambulatory Visit: Payer: Self-pay | Admitting: Advanced Practice Midwife

## 2015-01-12 ENCOUNTER — Encounter (HOSPITAL_COMMUNITY): Payer: Self-pay

## 2015-01-12 ENCOUNTER — Ambulatory Visit (HOSPITAL_COMMUNITY)
Admission: RE | Admit: 2015-01-12 | Discharge: 2015-01-12 | Disposition: A | Discharge status: Home / Self Care | Payer: Commercial Managed Care - PPO | Source: Ambulatory Visit | Attending: Advanced Practice Midwife | Admitting: Advanced Practice Midwife

## 2015-01-12 VITALS — BP 124/71 | HR 89 | Wt 241.2 lb

## 2015-01-12 DIAGNOSIS — O3432 Maternal care for cervical incompetence, second trimester: Secondary | ICD-10-CM

## 2015-01-12 DIAGNOSIS — Z8751 Personal history of pre-term labor: Secondary | ICD-10-CM

## 2015-01-12 DIAGNOSIS — O09292 Supervision of pregnancy with other poor reproductive or obstetric history, second trimester: Secondary | ICD-10-CM

## 2015-01-12 DIAGNOSIS — O99212 Obesity complicating pregnancy, second trimester: Secondary | ICD-10-CM

## 2015-01-12 DIAGNOSIS — Z3A22 22 weeks gestation of pregnancy: Secondary | ICD-10-CM | POA: Diagnosis not present

## 2015-01-12 DIAGNOSIS — E669 Obesity, unspecified: Secondary | ICD-10-CM | POA: Diagnosis not present

## 2015-01-12 DIAGNOSIS — O3421 Maternal care for scar from previous cesarean delivery: Secondary | ICD-10-CM | POA: Diagnosis not present

## 2015-01-12 DIAGNOSIS — O0991 Supervision of high risk pregnancy, unspecified, first trimester: Secondary | ICD-10-CM

## 2015-01-12 DIAGNOSIS — O34219 Maternal care for unspecified type scar from previous cesarean delivery: Secondary | ICD-10-CM

## 2015-01-15 ENCOUNTER — Ambulatory Visit (INDEPENDENT_AMBULATORY_CARE_PROVIDER_SITE_OTHER): Payer: Commercial Managed Care - PPO | Admitting: *Deleted

## 2015-01-15 DIAGNOSIS — Z8751 Personal history of pre-term labor: Secondary | ICD-10-CM

## 2015-01-15 DIAGNOSIS — Z8759 Personal history of other complications of pregnancy, childbirth and the puerperium: Secondary | ICD-10-CM

## 2015-01-22 ENCOUNTER — Encounter: Payer: Commercial Managed Care - PPO | Admitting: Advanced Practice Midwife

## 2015-01-26 ENCOUNTER — Ambulatory Visit (INDEPENDENT_AMBULATORY_CARE_PROVIDER_SITE_OTHER): Payer: Commercial Managed Care - PPO | Admitting: *Deleted

## 2015-01-26 DIAGNOSIS — Z8751 Personal history of pre-term labor: Secondary | ICD-10-CM

## 2015-01-29 ENCOUNTER — Ambulatory Visit (INDEPENDENT_AMBULATORY_CARE_PROVIDER_SITE_OTHER): Payer: Commercial Managed Care - PPO | Admitting: Family

## 2015-01-29 VITALS — BP 139/76 | HR 90 | Wt 243.0 lb

## 2015-01-29 DIAGNOSIS — O0991 Supervision of high risk pregnancy, unspecified, first trimester: Secondary | ICD-10-CM

## 2015-01-29 DIAGNOSIS — R7309 Other abnormal glucose: Secondary | ICD-10-CM | POA: Diagnosis not present

## 2015-01-29 LAB — GLUCOSE, POCT (MANUAL RESULT ENTRY): POC Glucose: 153 mg/dl — AB (ref 70–99)

## 2015-01-29 NOTE — Progress Notes (Signed)
Subjective:  Ruth Duran is a 29 y.o. Z6X0960 at [redacted]w[redacted]d being seen today for ongoing prenatal care.  Patient reports no complaints.  Contractions: Not present.  Vag. Bleeding: None. Movement: Present. Denies leaking of fluid. Reports eating two donuts and coffee right before coming in.  The following portions of the patient's history were reviewed and updated as appropriate: allergies, current medications, past family history, past medical history, past social history, past surgical history and problem list.   Objective:   Filed Vitals:   01/29/15 1118  BP: 139/76  Pulse: 90  Weight: 243 lb (110.224 kg)    Fetal Status: Fetal Heart Rate (bpm): 150 Fundal Height: 26 cm Movement: Present     General:  Alert, oriented and cooperative. Patient is in no acute distress.  Skin: Skin is warm and dry. No rash noted.   Cardiovascular: Normal heart rate noted  Respiratory: Normal respiratory effort, no problems with respiration noted  Abdomen: Soft, gravid, appropriate for gestational age. Pain/Pressure: Present     Pelvic: Vag. Bleeding: None Vag D/C Character: Thin   Cervical exam deferred        Extremities: Normal range of motion.  Edema: Trace  Mental Status: Normal mood and affect. Normal behavior. Normal judgment and thought content.   Urinalysis: Urine Protein: Negative Urine Glucose: 3+  Assessment and Plan:  Pregnancy: A5W0981 at [redacted]w[redacted]d  1. Elevated glucose - POCT Glucose (CBG) - Discussed carbohydrates and balanced meal planning; 1 hr at next appt in two weeks  2. Supervision of high risk pregnancy in second trimester - Continue 17-p - Reviewed growth [place] (40%ile)  Preterm labor symptoms and general obstetric precautions including but not limited to vaginal bleeding, contractions, leaking of fluid and fetal movement were reviewed in detail with the patient. Please refer to After Visit Summary for other counseling recommendations.  Return in about 1 week (around 02/05/2015) for  17 p and 2 wks for 17p and appt.   Eino Farber Kennith Gain, CNM

## 2015-01-29 NOTE — Progress Notes (Signed)
Has had 3 doughnuts and sugary coffee this morning.

## 2015-02-02 ENCOUNTER — Ambulatory Visit (INDEPENDENT_AMBULATORY_CARE_PROVIDER_SITE_OTHER): Payer: Commercial Managed Care - PPO | Admitting: *Deleted

## 2015-02-02 DIAGNOSIS — O09291 Supervision of pregnancy with other poor reproductive or obstetric history, first trimester: Secondary | ICD-10-CM

## 2015-02-02 DIAGNOSIS — Z8751 Personal history of pre-term labor: Secondary | ICD-10-CM

## 2015-02-08 ENCOUNTER — Ambulatory Visit (INDEPENDENT_AMBULATORY_CARE_PROVIDER_SITE_OTHER): Payer: Commercial Managed Care - PPO | Admitting: Advanced Practice Midwife

## 2015-02-08 VITALS — BP 119/73 | HR 94 | Wt 247.0 lb

## 2015-02-08 DIAGNOSIS — O34219 Maternal care for unspecified type scar from previous cesarean delivery: Secondary | ICD-10-CM

## 2015-02-08 DIAGNOSIS — Z36 Encounter for antenatal screening of mother: Secondary | ICD-10-CM | POA: Diagnosis not present

## 2015-02-08 DIAGNOSIS — Z3490 Encounter for supervision of normal pregnancy, unspecified, unspecified trimester: Secondary | ICD-10-CM

## 2015-02-08 DIAGNOSIS — Z8751 Personal history of pre-term labor: Secondary | ICD-10-CM | POA: Diagnosis not present

## 2015-02-08 DIAGNOSIS — O0992 Supervision of high risk pregnancy, unspecified, second trimester: Secondary | ICD-10-CM

## 2015-02-08 DIAGNOSIS — O0991 Supervision of high risk pregnancy, unspecified, first trimester: Secondary | ICD-10-CM

## 2015-02-08 DIAGNOSIS — O3421 Maternal care for scar from previous cesarean delivery: Secondary | ICD-10-CM

## 2015-02-08 NOTE — Progress Notes (Signed)
Subjective:  Ruth Duran is a 29 y.o. Z6X0960 at [redacted]w[redacted]d being seen today for ongoing prenatal care.  Patient reports no complaints.  Contractions: Not present.  Vag. Bleeding: None. Movement: Present. Denies leaking of fluid.   The following portions of the patient's history were reviewed and updated as appropriate: allergies, current medications, past family history, past medical history, past social history, past surgical history and problem list.   Objective:   Filed Vitals:   02/08/15 0855  BP: 119/73  Pulse: 94  Weight: 247 lb (112.038 kg)    Fetal Status: Fetal Heart Rate (bpm): 153   Movement: Present     General:  Alert, oriented and cooperative. Patient is in no acute distress.  Skin: Skin is warm and dry. No rash noted.   Cardiovascular: Normal heart rate noted  Respiratory: Normal respiratory effort, no problems with respiration noted  Abdomen: Soft, gravid, appropriate for gestational age. Pain/Pressure: Absent     Pelvic: Vag. Bleeding: None Vag D/C Character: Thin   Cervical exam deferred        Extremities: Normal range of motion.  Edema: None  Mental Status: Normal mood and affect. Normal behavior. Normal judgment and thought content.   Urinalysis:      Assessment and Plan:  Pregnancy: A5W0981 at [redacted]w[redacted]d  1. Supervision of normal pregnancy, unspecified trimester  - Glucose Tolerance, 1 HR (50g) - CBC - HIV antibody (with reflex) - RPR  2. Supervision of high risk pregnancy in first trimester   3. History of cesarean section complicating pregnancy Repeat at 39 weeks  Preterm labor symptoms and general obstetric precautions including but not limited to vaginal bleeding, contractions, leaking of fluid and fetal movement were reviewed in detail with the patient. Please refer to After Visit Summary for other counseling recommendations.  Return in about 3 weeks (around 03/01/2015).  TDaP at [place].   Dorathy Kinsman, CNM

## 2015-02-08 NOTE — Patient Instructions (Signed)

## 2015-02-09 ENCOUNTER — Encounter: Payer: Self-pay | Admitting: Advanced Practice Midwife

## 2015-02-09 ENCOUNTER — Telehealth: Payer: Self-pay | Admitting: *Deleted

## 2015-02-09 ENCOUNTER — Encounter: Payer: Commercial Managed Care - PPO | Admitting: Obstetrics & Gynecology

## 2015-02-09 DIAGNOSIS — O24419 Gestational diabetes mellitus in pregnancy, unspecified control: Secondary | ICD-10-CM

## 2015-02-09 LAB — CBC
HCT: 31.7 % — ABNORMAL LOW (ref 36.0–46.0)
Hemoglobin: 10.6 g/dL — ABNORMAL LOW (ref 12.0–15.0)
MCH: 29.6 pg (ref 26.0–34.0)
MCHC: 33.4 g/dL (ref 30.0–36.0)
MCV: 88.5 fL (ref 78.0–100.0)
MPV: 8.7 fL (ref 8.6–12.4)
PLATELETS: 245 10*3/uL (ref 150–400)
RBC: 3.58 MIL/uL — ABNORMAL LOW (ref 3.87–5.11)
RDW: 14.1 % (ref 11.5–15.5)
WBC: 9.7 10*3/uL (ref 4.0–10.5)

## 2015-02-09 LAB — RPR

## 2015-02-09 LAB — GLUCOSE TOLERANCE, 1 HOUR (50G) W/O FASTING: Glucose, 1 Hour GTT: 201 mg/dL — ABNORMAL HIGH (ref 70–140)

## 2015-02-09 LAB — HIV ANTIBODY (ROUTINE TESTING W REFLEX): HIV 1&2 Ab, 4th Generation: NONREACTIVE

## 2015-02-09 NOTE — Telephone Encounter (Signed)
Pt notified of abnormal 1 hr GTT  201.  [facility] to [facility] for [facility] and counseling.  Pt is aware that she is to bring her glucose readings every time she sees a provider.

## 2015-02-15 ENCOUNTER — Ambulatory Visit: Payer: Commercial Managed Care - PPO

## 2015-02-16 ENCOUNTER — Ambulatory Visit (INDEPENDENT_AMBULATORY_CARE_PROVIDER_SITE_OTHER): Payer: Commercial Managed Care - PPO | Admitting: *Deleted

## 2015-02-16 DIAGNOSIS — Z8751 Personal history of pre-term labor: Secondary | ICD-10-CM | POA: Diagnosis not present

## 2015-02-17 ENCOUNTER — Encounter: Payer: Commercial Managed Care - PPO | Attending: Obstetrics and Gynecology

## 2015-02-17 ENCOUNTER — Telehealth: Payer: Self-pay | Admitting: *Deleted

## 2015-02-17 VITALS — Ht 62.0 in | Wt 247.7 lb

## 2015-02-17 DIAGNOSIS — Z713 Dietary counseling and surveillance: Secondary | ICD-10-CM | POA: Diagnosis not present

## 2015-02-17 DIAGNOSIS — O24410 Gestational diabetes mellitus in pregnancy, diet controlled: Secondary | ICD-10-CM | POA: Diagnosis not present

## 2015-02-17 DIAGNOSIS — O24419 Gestational diabetes mellitus in pregnancy, unspecified control: Secondary | ICD-10-CM

## 2015-02-17 MED ORDER — GLUCOSE BLOOD VI STRP: ORAL_STRIP | Status: DC

## 2015-02-17 MED ORDER — ONETOUCH ULTRASOFT LANCETS MISC: Status: DC

## 2015-02-17 NOTE — Progress Notes (Signed)
  Patient was seen on 02/17/15 for Gestational Diabetes self-management . The following learning objectives were met by the patient :   States the definition of Gestational Diabetes  States why dietary management is important in controlling blood glucose  Describes the effects of carbohydrates on blood glucose levels  Demonstrates ability to create a balanced meal plan  Demonstrates carbohydrate counting   States when to check blood glucose levels  Demonstrates proper blood glucose monitoring techniques  States the effect of stress and exercise on blood glucose levels  States the importance of limiting caffeine and abstaining from alcohol and smoking  Plan:  Aim for 2 Carb Choices per meal (30 grams) +/- 1 either way for breakfast Aim for 3 Carb Choices per meal (45 grams) +/- 1 either way from lunch and dinner Aim for 1-2 Carbs per snack Begin reading food labels for Total Carbohydrate and sugar grams of foods Consider  increasing your activity level by walking daily as tolerated Begin checking BG before breakfast and 2 hours after first bit of breakfast, lunch and dinner after  as directed by MD  Take medication  as directed by MD  Blood glucose monitor given: One Touch Verio Flex  Lot # [id number] X Exp: 02/2016 Blood glucose reading: $RemoveBeforeDE'82mg'OfznoxHYNjbZhvS$ /dl  Patient instructed to monitor glucose levels: FBS: 60 - <90 2 hour: <120  Patient received the following handouts:  Nutrition Diabetes and Pregnancy  Carbohydrate Counting List  Meal Planning worksheet  Patient will be seen for follow-up as needed.

## 2015-02-17 NOTE — Telephone Encounter (Signed)
Lancets and test strips sent to [facility]  For the One touch glucometer.

## 2015-02-22 ENCOUNTER — Ambulatory Visit (INDEPENDENT_AMBULATORY_CARE_PROVIDER_SITE_OTHER): Payer: Commercial Managed Care - PPO | Admitting: *Deleted

## 2015-02-22 DIAGNOSIS — Z8751 Personal history of pre-term labor: Secondary | ICD-10-CM | POA: Diagnosis not present

## 2015-03-01 ENCOUNTER — Ambulatory Visit (INDEPENDENT_AMBULATORY_CARE_PROVIDER_SITE_OTHER): Payer: Commercial Managed Care - PPO | Admitting: Family

## 2015-03-01 VITALS — BP 115/74 | HR 103 | Wt 241.0 lb

## 2015-03-01 DIAGNOSIS — Z8751 Personal history of pre-term labor: Secondary | ICD-10-CM

## 2015-03-01 DIAGNOSIS — O24410 Gestational diabetes mellitus in pregnancy, diet controlled: Secondary | ICD-10-CM

## 2015-03-01 DIAGNOSIS — Z3483 Encounter for supervision of other normal pregnancy, third trimester: Secondary | ICD-10-CM

## 2015-03-01 DIAGNOSIS — Z8759 Personal history of other complications of pregnancy, childbirth and the puerperium: Secondary | ICD-10-CM

## 2015-03-01 NOTE — Progress Notes (Signed)
Subjective:  Ruth Duran is a 29 y.o. [id number] at [redacted]w[redacted]d being seen today for ongoing prenatal care.  Patient reports no complaints.  Concerned about overall weight.  Contractions: Not present.  Vag. Bleeding: None. Movement: Present. Denies leaking of fluid.   The following portions of the patient's history were reviewed and updated as appropriate: allergies, current medications, past family history, past medical history, past social history, past surgical history and problem list.   Objective:   Filed Vitals:   03/01/15 1101  BP: 115/74  Pulse: 103  Weight: 241 lb (109.317 kg)    Fetal Status: Fetal Heart Rate (bpm): 151   Movement: Present     General:  Alert, oriented and cooperative. Patient is in no acute distress.  Skin: Skin is warm and dry. No rash noted.   Cardiovascular: Normal heart rate noted  Respiratory: Normal respiratory effort, no problems with respiration noted  Abdomen: Soft, gravid, appropriate for gestational age. Pain/Pressure: Absent     Pelvic: Vag. Bleeding: None Vag D/C Character: Thin   Cervical exam deferred        Extremities: Normal range of motion.  Edema: None  Mental Status: Normal mood and affect. Normal behavior. Normal judgment and thought content.   Urinalysis: Urine Protein: Negative Urine Glucose: Negative  Fasting PPB PPL PPD  82-123, 8/12 abnl 90-158, 3/12 abnl 80-163, 7/11 abnl 79-162, 6/10 abnl   Assessment and Plan:  Pregnancy: [id number] at [redacted]w[redacted]d  1. History of placenta abruption - [place] MFM OB FOLLOW UP; Future > growth  2. Hx of PPROM - 17 p weekly   3.  Gestational Diabetes  - Discussed adding medication; after a review of diet (increased fruit, bread, tortillas) will adjust diet, increase walking and reevaluate in one week.  Preterm labor symptoms and general obstetric precautions including but not limited to vaginal bleeding, contractions, leaking of fluid and fetal movement were reviewed in detail with the patient. Please refer  to After Visit Summary for other counseling recommendations.  No Follow-up on file.   Eino Farber Kennith Gain, CNM

## 2015-03-02 ENCOUNTER — Other Ambulatory Visit: Payer: Self-pay | Admitting: Family

## 2015-03-02 ENCOUNTER — Ambulatory Visit (HOSPITAL_COMMUNITY)
Admission: RE | Admit: 2015-03-02 | Discharge: 2015-03-02 | Disposition: A | Discharge status: Home / Self Care | Payer: Commercial Managed Care - PPO | Source: Ambulatory Visit | Attending: Family | Admitting: Family

## 2015-03-02 ENCOUNTER — Encounter (HOSPITAL_COMMUNITY): Payer: Self-pay

## 2015-03-02 VITALS — BP 105/80 | HR 105 | Wt 242.0 lb

## 2015-03-02 DIAGNOSIS — Z3A30 30 weeks gestation of pregnancy: Secondary | ICD-10-CM | POA: Diagnosis not present

## 2015-03-02 DIAGNOSIS — Z8751 Personal history of pre-term labor: Secondary | ICD-10-CM

## 2015-03-02 DIAGNOSIS — O99213 Obesity complicating pregnancy, third trimester: Secondary | ICD-10-CM | POA: Insufficient documentation

## 2015-03-02 DIAGNOSIS — O09299 Supervision of pregnancy with other poor reproductive or obstetric history, unspecified trimester: Secondary | ICD-10-CM

## 2015-03-02 DIAGNOSIS — O24410 Gestational diabetes mellitus in pregnancy, diet controlled: Secondary | ICD-10-CM

## 2015-03-02 DIAGNOSIS — Z8759 Personal history of other complications of pregnancy, childbirth and the puerperium: Secondary | ICD-10-CM

## 2015-03-02 DIAGNOSIS — O34219 Maternal care for unspecified type scar from previous cesarean delivery: Secondary | ICD-10-CM

## 2015-03-02 DIAGNOSIS — E669 Obesity, unspecified: Secondary | ICD-10-CM | POA: Diagnosis not present

## 2015-03-02 DIAGNOSIS — O288 Other abnormal findings on antenatal screening of mother: Secondary | ICD-10-CM

## 2015-03-02 DIAGNOSIS — O0991 Supervision of high risk pregnancy, unspecified, first trimester: Secondary | ICD-10-CM

## 2015-03-03 DIAGNOSIS — O288 Other abnormal findings on antenatal screening of mother: Secondary | ICD-10-CM | POA: Insufficient documentation

## 2015-03-04 ENCOUNTER — Ambulatory Visit (INDEPENDENT_AMBULATORY_CARE_PROVIDER_SITE_OTHER): Payer: Commercial Managed Care - PPO | Admitting: Obstetrics & Gynecology

## 2015-03-04 VITALS — BP 127/74 | HR 98 | Wt 242.0 lb

## 2015-03-04 DIAGNOSIS — O24410 Gestational diabetes mellitus in pregnancy, diet controlled: Secondary | ICD-10-CM

## 2015-03-04 DIAGNOSIS — O0991 Supervision of high risk pregnancy, unspecified, first trimester: Secondary | ICD-10-CM

## 2015-03-04 NOTE — Progress Notes (Signed)
Subjective:  Ruth Duran is a 29 y.o. G2X5284G4P1202 at 9858w3d being seen today for ongoing prenatal care.  Patient reports She is here for a work in visit as she was feeling very frustrated about her sugars and her desire to eat things that she knows are not good for her sugars. She reports that she has been very emotional and tearful throughout this pregnancy and that she is fatigued all the time..  Contractions: Not present.  Vag. Bleeding: None. Movement: Present. Denies leaking of fluid.   The following portions of the patient's history were reviewed and updated as appropriate: allergies, current medications, past family history, past medical history, past social history, past surgical history and problem list. Problem list updated.  Objective:   Filed Vitals:   03/04/15 1334  BP: 127/74  Pulse: 98  Weight: 242 lb (109.77 kg)    Fetal Status: Fetal Heart Rate (bpm): 149   Movement: Present     General:  Alert, oriented and cooperative. Patient is in no acute distress.  Skin: Skin is warm and dry. No rash noted.   Cardiovascular: Normal heart rate noted  Respiratory: Normal respiratory effort, no problems with respiration noted  Abdomen: Soft, gravid, appropriate for gestational age. Pain/Pressure: Absent     Pelvic: Vag. Bleeding: None Vag D/C Character: Thin   Cervical exam deferred        Extremities: Normal range of motion.  Edema: None  Mental Status: Normal mood and affect. Normal behavior. Normal judgment and thought content.   Urinalysis: Urine Protein: Negative Urine Glucose: Negative  Assessment and Plan:  Pregnancy: X3K4401G4P1202 at 7458w3d  1. Supervision of high risk pregnancy in first trimester - She declines a flu vaccine today. She also declines a psychology referral. Reassurance given.  2. Diet controlled gestational diabetes mellitus in third trimester Her sugars are actually quite good, only an occasion number out of range (and not far out of range)  Preterm labor  symptoms and general obstetric precautions including but not limited to vaginal bleeding, contractions, leaking of fluid and fetal movement were reviewed in detail with the patient. Please refer to After Visit Summary for other counseling recommendations.  Return for keep appt.   Allie BossierMyra C Decarlo Rivet, MD

## 2015-03-04 NOTE — Progress Notes (Signed)
Having a lot of anxiety/crying frustrated with her gestational diabetes

## 2015-03-08 ENCOUNTER — Ambulatory Visit (INDEPENDENT_AMBULATORY_CARE_PROVIDER_SITE_OTHER): Payer: Commercial Managed Care - PPO | Admitting: *Deleted

## 2015-03-08 DIAGNOSIS — Z8751 Personal history of pre-term labor: Secondary | ICD-10-CM

## 2015-03-15 ENCOUNTER — Ambulatory Visit (INDEPENDENT_AMBULATORY_CARE_PROVIDER_SITE_OTHER): Payer: Commercial Managed Care - PPO | Admitting: Advanced Practice Midwife

## 2015-03-15 ENCOUNTER — Other Ambulatory Visit: Payer: Self-pay | Admitting: *Deleted

## 2015-03-15 VITALS — BP 113/76 | HR 108 | Wt 243.0 lb

## 2015-03-15 DIAGNOSIS — O0991 Supervision of high risk pregnancy, unspecified, first trimester: Secondary | ICD-10-CM

## 2015-03-15 DIAGNOSIS — Z8751 Personal history of pre-term labor: Secondary | ICD-10-CM | POA: Diagnosis not present

## 2015-03-15 DIAGNOSIS — O24419 Gestational diabetes mellitus in pregnancy, unspecified control: Secondary | ICD-10-CM

## 2015-03-15 DIAGNOSIS — O24414 Gestational diabetes mellitus in pregnancy, insulin controlled: Secondary | ICD-10-CM

## 2015-03-15 MED ORDER — GLUCOSE BLOOD VI STRP: ORAL_STRIP | Status: DC

## 2015-03-15 MED ORDER — ONETOUCH ULTRASOFT LANCETS MISC
Status: DC
Start: 2015-03-15 — End: 2015-05-06

## 2015-03-15 MED ORDER — GLYBURIDE 2.5 MG PO TABS: 2.5000 mg | ORAL_TABLET | Freq: Every day | ORAL | Status: DC

## 2015-03-15 NOTE — Progress Notes (Signed)
Fasting CBGs: 100-108 (100% out of range) PCB:  96-165 (50% out of range) PCL:  92-196 (66% out of range) PCD: Did not check   Subjective:  Ruth Duran is a 29 y.o. Z6X0960G4P1202 at 7160w0d being seen today for ongoing prenatal care.  Patient reports occasional contractions and being very anxious about diabetes. Feels like she is eating almost nothing and still having high CBGs. Got very frustrated about diabetes this weekend and was not as strict w/ diet.  Contractions: Irritability.  Vag. Bleeding: None. Movement: Present. Denies leaking of fluid.   The following portions of the patient's history were reviewed and updated as appropriate: allergies, current medications, past family history, past medical history, past social history, past surgical history and problem list. Problem list updated.  Objective:   Filed Vitals:   03/15/15 1103  BP: 113/76  Pulse: 108  Weight: 243 lb (110.224 kg)    Fetal Status:     Movement: Present     General:  Alert, oriented and cooperative. Patient is in no acute distress.  Skin: Skin is warm and dry. No rash noted.   Cardiovascular: Normal heart rate noted  Respiratory: Normal respiratory effort, no problems with respiration noted  Abdomen: Soft, gravid, appropriate for gestational age. Pain/Pressure: Absent     Pelvic: Vag. Bleeding: None Vag D/C Character: Thin   Cervical exam performed      Closed and long  Extremities: Normal range of motion.  Edema: None  Mental Status: Normal mood and affect. Normal behavior. Normal judgment and thought content.   Urinalysis:      Assessment and Plan:  Pregnancy: A5W0981G4P1202 at 6960w0d  1. Gestational diabetes mellitus, unspecified diabetic control, unspecified trimester Glyburide 2.5 mg QHS Start antenatal testing Support given RE: GDM. Discussed that  Some women cannot control blood sugar w/ diet alone. Encouraged to still try, btu need to start meds. Refill strips and lancets.   2. Deberah PeltonBraxton Hicks  3. Previous  C/S In-basket message to [place] to schedule RLTCS at 39 weeks  4. Low AFI AFI/BPP w/ MFM tomorrow 10/25   Preterm labor symptoms and general obstetric precautions including but not limited to vaginal bleeding, contractions, leaking of fluid and fetal movement were reviewed in detail with the patient. Please refer to After Visit Summary for other counseling recommendations.  F/U in 1 week to check CBGs   Dorathy KinsmanVirginia Aristides Luckey, CNM

## 2015-03-15 NOTE — Telephone Encounter (Signed)
[facility].  Amount increased to accommodate QID testing

## 2015-03-16 ENCOUNTER — Ambulatory Visit (HOSPITAL_COMMUNITY)
Admission: RE | Admit: 2015-03-16 | Discharge: 2015-03-16 | Disposition: A | Discharge status: Home / Self Care | Payer: Commercial Managed Care - PPO | Source: Ambulatory Visit | Attending: Family | Admitting: Family

## 2015-03-16 ENCOUNTER — Other Ambulatory Visit (HOSPITAL_COMMUNITY): Payer: Self-pay | Admitting: Maternal and Fetal Medicine

## 2015-03-16 DIAGNOSIS — O99213 Obesity complicating pregnancy, third trimester: Secondary | ICD-10-CM | POA: Diagnosis not present

## 2015-03-16 DIAGNOSIS — Z3A32 32 weeks gestation of pregnancy: Secondary | ICD-10-CM | POA: Diagnosis not present

## 2015-03-16 DIAGNOSIS — O09292 Supervision of pregnancy with other poor reproductive or obstetric history, second trimester: Secondary | ICD-10-CM | POA: Diagnosis not present

## 2015-03-16 DIAGNOSIS — O24415 Gestational diabetes mellitus in pregnancy, controlled by oral hypoglycemic drugs: Secondary | ICD-10-CM

## 2015-03-16 DIAGNOSIS — O24410 Gestational diabetes mellitus in pregnancy, diet controlled: Secondary | ICD-10-CM

## 2015-03-16 DIAGNOSIS — O34219 Maternal care for unspecified type scar from previous cesarean delivery: Secondary | ICD-10-CM | POA: Diagnosis not present

## 2015-03-16 DIAGNOSIS — E669 Obesity, unspecified: Secondary | ICD-10-CM | POA: Insufficient documentation

## 2015-03-22 ENCOUNTER — Ambulatory Visit (INDEPENDENT_AMBULATORY_CARE_PROVIDER_SITE_OTHER): Payer: Commercial Managed Care - PPO | Admitting: Family

## 2015-03-22 VITALS — BP 134/81 | HR 95 | Wt 243.0 lb

## 2015-03-22 DIAGNOSIS — O24410 Gestational diabetes mellitus in pregnancy, diet controlled: Secondary | ICD-10-CM

## 2015-03-22 DIAGNOSIS — Z8759 Personal history of other complications of pregnancy, childbirth and the puerperium: Secondary | ICD-10-CM

## 2015-03-22 DIAGNOSIS — O24419 Gestational diabetes mellitus in pregnancy, unspecified control: Secondary | ICD-10-CM

## 2015-03-22 DIAGNOSIS — O289 Unspecified abnormal findings on antenatal screening of mother: Secondary | ICD-10-CM

## 2015-03-22 DIAGNOSIS — O288 Other abnormal findings on antenatal screening of mother: Secondary | ICD-10-CM

## 2015-03-22 LAB — GLUCOSE, POCT (MANUAL RESULT ENTRY): POC Glucose: 75 mg/dl (ref 70–99)

## 2015-03-22 NOTE — Progress Notes (Signed)
Subjective:  Ruth Duran is a 29 y.o. Z6X0960G4P1202 at 6329w0d being seen today for ongoing prenatal care.  Patient reports back and groin pain.  Increased walking at beach this past weekend.  Contractions: Irregular.  Vag. Bleeding: None. Movement: Present. Denies leaking of fluid.   The following portions of the patient's history were reviewed and updated as appropriate: allergies, current medications, past family history, past medical history, past social history, past surgical history and problem list. Problem list updated.  Objective:   Filed Vitals:   03/22/15 1100  BP: 134/81  Pulse: 95  Weight: 243 lb (110.224 kg)    Fetal Status: Fetal Heart Rate (bpm): 131 Fundal Height: 34 cm Movement: Present     General:  Alert, oriented and cooperative. Patient is in no acute distress.  Skin: Skin is warm and dry. No rash noted.   Cardiovascular: Normal heart rate noted  Respiratory: Normal respiratory effort, no problems with respiration noted  Abdomen: Soft, gravid, appropriate for gestational age. Pain/Pressure: Present     Pelvic: Vag. Bleeding: None Vag D/C Character: Thin   Cervical exam deferred        Extremities: Normal range of motion.  Edema: Trace  Mental Status: Normal mood and affect. Normal behavior. Normal judgment and thought content.   Urinalysis: Urine Protein: Negative Urine Glucose: Negative Did not bring glucose log;  Reports medication has helped decreased FBG to 70's.  Last meal last night; Random FBS 76  Assessment and Plan:  Pregnancy: A5W0981G4P1202 at 3829w0d  1. Gestational diabetes mellitus, unspecified diabetic control, unspecified trimester - POCT Glucose (CBG) > 76 - Fetal nonstress test (Begin fetal testing)  2. History of placenta abruption Growth ultrasound in 2 weeks  4. AFI (amniotic fluid index) borderline low - Resolved, normal AFI on 03/16/15  Preterm labor symptoms and general obstetric precautions including but not limited to vaginal bleeding,  contractions, leaking of fluid and fetal movement were reviewed in detail with the patient. Please refer to After Visit Summary for other counseling recommendations.  Return in about 3 days (around 03/25/2015) for NST; begin twice week testing.   Eino FarberWalidah Kennith GainN Karim, CNM

## 2015-03-25 ENCOUNTER — Telehealth: Payer: Self-pay

## 2015-03-25 ENCOUNTER — Ambulatory Visit (INDEPENDENT_AMBULATORY_CARE_PROVIDER_SITE_OTHER): Payer: Commercial Managed Care - PPO | Admitting: *Deleted

## 2015-03-25 VITALS — BP 114/69 | HR 92

## 2015-03-25 DIAGNOSIS — O24419 Gestational diabetes mellitus in pregnancy, unspecified control: Secondary | ICD-10-CM

## 2015-03-25 NOTE — Progress Notes (Signed)
Pt here for NST/AFI for Gestational Diabetes NST/AFI documented in Fetal Testing

## 2015-03-25 NOTE — Telephone Encounter (Signed)
Upon check-out pt could not take off 3 days next week for appointments. Since she was already scheduled for her [facility] afi and [facility], we decided to schedule her 17p only appt on Monday 11-07, U/S and NST on Tuesday 11-8 and come back on Monday 11-14 for OB follow up, NST and 17p.  Patient was advised by the nurse she should also come in on Friday 11-11 for NST but due to patient work schedule she cannot take of 3 days in a row due to time. Patient stated she is ok with waiting and the office and the patient will continue to call Ultrasound department to see if there are any cancellations to try to make her schedule work.

## 2015-03-29 ENCOUNTER — Ambulatory Visit (INDEPENDENT_AMBULATORY_CARE_PROVIDER_SITE_OTHER): Payer: Commercial Managed Care - PPO | Admitting: *Deleted

## 2015-03-29 DIAGNOSIS — Z8751 Personal history of pre-term labor: Secondary | ICD-10-CM

## 2015-03-30 ENCOUNTER — Ambulatory Visit (HOSPITAL_COMMUNITY): Payer: Commercial Managed Care - PPO

## 2015-03-30 ENCOUNTER — Ambulatory Visit (HOSPITAL_COMMUNITY)
Admission: RE | Admit: 2015-03-30 | Discharge: 2015-03-30 | Disposition: A | Discharge status: Home / Self Care | Payer: Commercial Managed Care - PPO | Source: Ambulatory Visit | Attending: Advanced Practice Midwife | Admitting: Advanced Practice Midwife

## 2015-03-30 ENCOUNTER — Other Ambulatory Visit (HOSPITAL_COMMUNITY): Payer: Self-pay | Admitting: Maternal and Fetal Medicine

## 2015-03-30 ENCOUNTER — Encounter (HOSPITAL_COMMUNITY): Payer: Self-pay

## 2015-03-30 DIAGNOSIS — O99213 Obesity complicating pregnancy, third trimester: Secondary | ICD-10-CM | POA: Diagnosis not present

## 2015-03-30 DIAGNOSIS — Z3A34 34 weeks gestation of pregnancy: Secondary | ICD-10-CM | POA: Diagnosis not present

## 2015-03-30 DIAGNOSIS — O24415 Gestational diabetes mellitus in pregnancy, controlled by oral hypoglycemic drugs: Secondary | ICD-10-CM | POA: Diagnosis not present

## 2015-03-30 DIAGNOSIS — O09293 Supervision of pregnancy with other poor reproductive or obstetric history, third trimester: Secondary | ICD-10-CM

## 2015-03-30 DIAGNOSIS — Z8751 Personal history of pre-term labor: Secondary | ICD-10-CM

## 2015-03-30 DIAGNOSIS — O09299 Supervision of pregnancy with other poor reproductive or obstetric history, unspecified trimester: Secondary | ICD-10-CM | POA: Diagnosis not present

## 2015-03-30 DIAGNOSIS — O34219 Maternal care for unspecified type scar from previous cesarean delivery: Secondary | ICD-10-CM | POA: Insufficient documentation

## 2015-04-02 ENCOUNTER — Encounter (HOSPITAL_COMMUNITY): Payer: Self-pay | Admitting: *Deleted

## 2015-04-02 ENCOUNTER — Inpatient Hospital Stay (HOSPITAL_COMMUNITY)
Admission: AD | Admit: 2015-04-02 | Discharge: 2015-04-02 | Disposition: A | Discharge status: Home / Self Care | Payer: Commercial Managed Care - PPO | Source: Ambulatory Visit | Attending: Obstetrics & Gynecology | Admitting: Obstetrics & Gynecology

## 2015-04-02 DIAGNOSIS — O26893 Other specified pregnancy related conditions, third trimester: Secondary | ICD-10-CM | POA: Diagnosis not present

## 2015-04-02 DIAGNOSIS — O321XX0 Maternal care for breech presentation, not applicable or unspecified: Secondary | ICD-10-CM | POA: Insufficient documentation

## 2015-04-02 DIAGNOSIS — Z3493 Encounter for supervision of normal pregnancy, unspecified, third trimester: Secondary | ICD-10-CM

## 2015-04-02 DIAGNOSIS — Z8249 Family history of ischemic heart disease and other diseases of the circulatory system: Secondary | ICD-10-CM | POA: Diagnosis not present

## 2015-04-02 DIAGNOSIS — Z3A34 34 weeks gestation of pregnancy: Secondary | ICD-10-CM | POA: Diagnosis not present

## 2015-04-02 DIAGNOSIS — Z87891 Personal history of nicotine dependence: Secondary | ICD-10-CM | POA: Insufficient documentation

## 2015-04-02 DIAGNOSIS — O24419 Gestational diabetes mellitus in pregnancy, unspecified control: Secondary | ICD-10-CM | POA: Diagnosis not present

## 2015-04-02 DIAGNOSIS — Z3689 Encounter for other specified antenatal screening: Secondary | ICD-10-CM

## 2015-04-02 HISTORY — DX: Calculus of kidney: N20.0

## 2015-04-02 HISTORY — DX: Gestational diabetes mellitus in pregnancy, unspecified control: O24.419

## 2015-04-02 NOTE — MAU Note (Signed)
past hour baby has been moving more than usual, having rhythmic movement  Like hiccoughs- but doesn't feel the same.

## 2015-04-02 NOTE — Discharge Instructions (Signed)

## 2015-04-02 NOTE — MAU Provider Note (Signed)
History     CSN: [id number]  Arrival date and time: 04/02/15 1316   First Provider Initiated Contact with Patient 04/02/15 1349      Chief Complaint  Patient presents with  . moving more than ususal    HPI Ruth Duran is 29 y.o. W2N5621 [redacted]w[redacted]d weeks presenting with concern over the increased activity "violent" movement.  Patient of V. Katrinka Blazing, CNM at [place].  Known breech presentation, has gestational diabetes.   Mom is aware baby has had hiccups in past but this activity felt different, stronger.  At this time, hiccups have stopped.  Neg for vaginal bleeding, LOF.  Has planned C-Sect 05/03/15.  Hx of fetal loss at 25 weeks after ROM at 13 weeks per patient report.      Past Medical History  Diagnosis Date  . Pancreatitis   . Preterm labor   . Gestational diabetes mellitus, antepartum   . Gestational diabetes   . Kidney stone     Past Surgical History  Procedure Laterality Date  . Cesarean section      x2    Family History  Problem Relation Age of Onset  . Diabetes Maternal Grandfather   . Heart attack Father     Social History  Substance Use Topics  . Smoking status: Former Smoker -- 0.20 packs/day for 4 years    Types: Cigarettes  . Smokeless tobacco: Never Used     Comment: April 2016  . Alcohol Use: No    Allergies: No Known Allergies  Facility-administered medications prior to admission  Medication Dose Route Frequency Provider Last Rate Last Dose  . hydroxyprogesterone caproate (DELALUTIN) 250 mg/mL injection 250 mg  250 mg Intramuscular Weekly Dorathy Kinsman, CNM   250 mg at 03/29/15 1113   Prescriptions prior to admission  Medication Sig Dispense Refill Last Dose  . calcium carbonate (TUMS - DOSED IN MG ELEMENTAL CALCIUM) 500 MG chewable tablet Chew 4 tablets by mouth as needed for indigestion or heartburn.   04/01/2015 at Unknown time  . glyBURIDE (DIABETA) 2.5 MG tablet Take 1 tablet (2.5 mg total) by mouth at bedtime. 60 tablet 2 04/01/2015 at  Unknown time  . Prenatal Vit-Fe Fumarate-FA (PRENATAL MULTIVITAMIN) TABS tablet Take 1 tablet by mouth daily at 12 noon.   04/01/2015 at Unknown time  . glucose blood test strip Use 4 times daily 120 each 12 Taking  . Lancets (ONETOUCH ULTRASOFT) lancets Use 4 times daily 120 each 12 Taking    Review of Systems  Constitutional: Negative for fever and chills.  Gastrointestinal: Abdominal pain: very active fetus, neg for contractions.  Genitourinary: Negative for dysuria, urgency, frequency and hematuria.       Negative for vaginal bleeding and loss of fluid.   Physical Exam   Blood pressure 129/74, pulse 92, temperature 98.3 F (36.8 C), temperature source Oral, resp. rate 20, last menstrual period 07/28/2014, unknown if currently breastfeeding.  Physical Exam  Nursing note and vitals reviewed. Constitutional: She is oriented to person, place, and time. She appears well-developed and well-nourished. No distress.  GI: There is no tenderness.  Abdomen is soft. Neg for palpable contractions.  Neurological: She is alert and oriented to person, place, and time.  Skin: Skin is warm and dry.  Psychiatric: She has a normal mood and affect. Her behavior is normal.    MAU Course  Procedures  MDM MSE Fetal monitor--strip is reactive Dr. Macon Large reviewed strip, agreed.    Assessment and Plan  A:  Increased fetal activity  NST reactive      4159w4d gestation  P:  Reassured      Patient aware Dr. Macon LargeAnyanwu reviewed NST      Call or return for worsening sxs      keep scheduled appt for continued prenatal care  KEY,EVE M 04/02/2015, 2:08 PM

## 2015-04-02 NOTE — MAU Note (Signed)
Urine sent to lab 

## 2015-04-02 NOTE — MAU Note (Signed)
Pt verbalized fear/anxiety, due to hx of PTD, neonatal death

## 2015-04-05 ENCOUNTER — Inpatient Hospital Stay (HOSPITAL_COMMUNITY): Payer: Commercial Managed Care - PPO

## 2015-04-05 ENCOUNTER — Inpatient Hospital Stay (HOSPITAL_COMMUNITY)
Admission: AD | Admit: 2015-04-05 | Discharge: 2015-04-05 | Discharge status: Against Medical Advice | Payer: Commercial Managed Care - PPO | Source: Ambulatory Visit | Attending: Obstetrics & Gynecology | Admitting: Obstetrics & Gynecology

## 2015-04-05 ENCOUNTER — Ambulatory Visit (INDEPENDENT_AMBULATORY_CARE_PROVIDER_SITE_OTHER): Payer: Commercial Managed Care - PPO | Admitting: Advanced Practice Midwife

## 2015-04-05 VITALS — BP 128/69 | HR 105 | Wt 242.0 lb

## 2015-04-05 DIAGNOSIS — Z3A35 35 weeks gestation of pregnancy: Secondary | ICD-10-CM | POA: Diagnosis not present

## 2015-04-05 DIAGNOSIS — Z3A34 34 weeks gestation of pregnancy: Secondary | ICD-10-CM | POA: Diagnosis not present

## 2015-04-05 DIAGNOSIS — O99213 Obesity complicating pregnancy, third trimester: Secondary | ICD-10-CM | POA: Insufficient documentation

## 2015-04-05 DIAGNOSIS — Z8751 Personal history of pre-term labor: Secondary | ICD-10-CM | POA: Diagnosis not present

## 2015-04-05 DIAGNOSIS — Z113 Encounter for screening for infections with a predominantly sexual mode of transmission: Secondary | ICD-10-CM | POA: Diagnosis not present

## 2015-04-05 DIAGNOSIS — O24414 Gestational diabetes mellitus in pregnancy, insulin controlled: Secondary | ICD-10-CM

## 2015-04-05 DIAGNOSIS — O288 Other abnormal findings on antenatal screening of mother: Secondary | ICD-10-CM

## 2015-04-05 DIAGNOSIS — O34219 Maternal care for unspecified type scar from previous cesarean delivery: Secondary | ICD-10-CM | POA: Insufficient documentation

## 2015-04-05 DIAGNOSIS — O09299 Supervision of pregnancy with other poor reproductive or obstetric history, unspecified trimester: Secondary | ICD-10-CM | POA: Diagnosis not present

## 2015-04-05 DIAGNOSIS — Z36 Encounter for antenatal screening of mother: Secondary | ICD-10-CM | POA: Diagnosis not present

## 2015-04-05 DIAGNOSIS — O24415 Gestational diabetes mellitus in pregnancy, controlled by oral hypoglycemic drugs: Secondary | ICD-10-CM | POA: Diagnosis not present

## 2015-04-05 NOTE — Patient Instructions (Signed)
Oligohydramnios °An unborn baby (fetus) lives in the mother's uterus in a sac of amniotic fluid. This fluid: °· Protects the fetus from trauma. °· Helps the fetus move freely inside the uterus. °· Helps the fetal lungs, kidneys, and digestive system develop. °· Protects the baby from infections.   °Oligohydramnios is when there is not enough amniotic fluid in the amniotic sac. If this happens early in pregnancy, a fetus might not develop normally. If this happens in the second half of a pregnancy, a fetus might not grow as much as it should and could cause problems during delivery.   °Oligohydramnios can also cause: °· Pregnancy loss (miscarriage). °· Premature birth. °· Deformities of the face or body. °· Problems with muscles and bones. °· Pressure and compression on the umbilical cord, which decreases oxygen to the fetus. °· Lung problems. °· Stillbirth. °CAUSES °A cause cannot always be found. However, possible causes include: °· A leak or a tear in the amniotic sac. °· A problem with the placenta. °· Having identical twins who share the same placenta. °· A fetal birth defect. This is usually something in the fetal kidneys or urinary tract that has not developed as it should. °· A pregnancy that goes past the due date. °· Something that affects the mother's body, such as: °¨ Dehydration. °¨ High blood pressure. °¨ Diabetes. °¨ Some medications (examples include ibuprofen and blood pressure medicines).  °¨ A disease that affects the skin, joints, kidneys, and other organs (systemic lupus). °¨ Birth defects. °SYMPTOMS °· There may be no symptoms.  °· Leaking fluid from the vagina. °· Measuring smaller than usual uterus at a routine pregnancy exam. °DIAGNOSIS °To diagnose and evaluate oligohydramnios, your caregiver may: °· Order a prenatal ultrasound test. This test: °¨ Measures the amniotic fluid level. This will show if the amount of fluid is right for the stage of pregnancy. °¨ Checks the fetal  kidneys. °¨ Checks fetal growth. °¨ Evaluates the placenta. °· Confirm that you broke your water, if this is suspected by your caregiver. °· Order a nonstress test. This noninvasive test is an assessment of fetal well-being. It monitors the fetal heart rate patterns over a 20-minute period. °· Order a biophysical profile. This test measures and evaluates 5 observations of the baby (results of nonstress testing, fetal breathing, movements, muscle tone, and amount of amniotic fluid). °· Assess fetal kick counts. Tell your caregiver if the baby appears to become less active. °· Order a uterine artery Doppler study. This is a type of ultrasound. It can show if enough blood and nourishment are getting to the fetus through the placenta and umbilical cord. °· Check your blood pressure. °· Check your blood sugar. °TREATMENT °Treatment will depend on how low the fluid is, how far along in the pregnancy you are, and your overall health. Treatment options include: °· Watching and waiting. You will need to be checked more often than normal. °· Increasing your fluid intake. This may be done by mouth, or you might get the fluids through the vein (intravenously, IV). °· Delivering the baby if recommended by your caregiver. °HOME CARE INSTRUCTIONS °· Only take medicine as directed by your caregiver, especially if you have a medical problem (diabetes, high blood pressure). °· Follow your caregiver's instructions regarding physical activity, especially if you have a medical problem (diabetes, high blood pressure). °· Keep all prenatal care appointments. °· Rest as much as possible. Your caregiver may put you on bed rest. °· Drink enough fluids to keep your urine clear or pale yellow. °·   Eat a healthy and nourishing diet. °· Do not smoke, drink alcohol, or take illegal drugs.  °SEEK MEDICAL CARE IF: °· You have any questions or worries about your pregnancy. °· You notice a decrease in fetal movement. °SEEK IMMEDIATE MEDICAL CARE IF:   °· Fluid comes out of your vagina. °· You start to have labor pains (contractions). °· You have a fever. °  °This information is not intended to replace advice given to you by your health care provider. Make sure you discuss any questions you have with your health care provider. °  °Document Released: 08/23/2010 Document Revised: 05/29/2014 Document Reviewed: 08/23/2010 °Elsevier Interactive Patient Education ©2016 Else[employer] Inc. ° °

## 2015-04-05 NOTE — Progress Notes (Signed)
Patient returned from U/S and is reassured with amount of amniotic fluid noted. Patient declines further testing (amnisure). Explained to patient why Theophilus Bonesamnisure has been ordered and the information to be obtained by it. Marlynn Perking. Lawson, CNM notified. Patient continues to insist that she is satisfied with U/S report and reactive NST. Refuses amnisure. AMA form signed.

## 2015-04-05 NOTE — Progress Notes (Signed)
Subjective:  Ruth Duran is a 29 y.o. [id number] at 3241w0d being seen today for ongoing prenatal care.  Patient reports no complaints. When specifiaclly asked about possible LOF 2/2 finding of low AFI pt reports few eposides of feeling dampness in her underwear or feeling a little fluid come out over that past week or two, but nothing that seemed like broken water.  Contractions: Irregular.  Vag. Bleeding: None. Movement: Present. Denies leaking of fluid.   The following portions of the patient's history were reviewed and updated as appropriate: allergies, current medications, past family history, past medical history, past social history, past surgical history and problem list. Problem list updated.  Objective:   Filed Vitals:   04/05/15 1132 04/05/15 1223  BP: 128/69 128/69  Pulse: 105 105  Weight:  242 lb (109.77 kg)    Fetal Status: Fetal Heart Rate (bpm): NST Reactive   Movement: Present  Presentation: Complete Breech AFI ~3 cm, but even that pocket may have had cord in it. Active fetus.  General:  Alert, oriented and cooperative. Patient is in no acute distress.  Skin: Skin is warm and dry. No rash noted.   Cardiovascular: Normal heart rate noted  Respiratory: Normal respiratory effort, no problems with respiration noted  Abdomen: Soft, gravid, appropriate for gestational age. Pain/Pressure: Present     Pelvic: Vag. Bleeding: None Vag D/C Character: Thin   Cervical exam performed      Visually closed. Neg pooling. Small amount of white, odorless, physiologic discharge.   Extremities: Normal range of motion.  Edema: Trace  Mental Status: Normal mood and affect. Normal behavior. Normal judgment and thought content.   Urinalysis: Urine Protein: Negative Urine Glucose: Negative   Assessment and Plan:  Pregnancy: A2Z3086G4P1202 at 6841w0d  1. History of preterm delivery  - Culture, beta strep (group b only) - GC/Chlamydia probe amp ([facility]  2. A2GDM  3. History of  C/S  Preterm labor symptoms and general obstetric precautions including but not limited to vaginal bleeding, contractions, leaking of fluid and fetal movement were reviewed in detail with the patient. Please refer to After Visit Summary for other counseling recommendations.  Discussed possible causes of low AFI including SROM, placental unsufficiancy or poor US quality/fetal position. If ruptured, will need to deliver. If not, timing of delivery depends on results of further testing.   To MAU for formal US to recheck AFI. Amnisure. Discussed AFI, Hx, exam w/ Dr. Macon LargeAnyanwu. Agrees w/ POC. Notified MAU provider.  NPO.  Ruth Duran, CNM

## 2015-04-07 ENCOUNTER — Inpatient Hospital Stay (HOSPITAL_COMMUNITY)
Admission: AD | Admit: 2015-04-07 | Discharge: 2015-04-07 | Disposition: A | Discharge status: Home / Self Care | Payer: Commercial Managed Care - PPO | Source: Ambulatory Visit | Attending: Obstetrics and Gynecology | Admitting: Obstetrics and Gynecology

## 2015-04-07 ENCOUNTER — Encounter (HOSPITAL_COMMUNITY): Payer: Self-pay | Admitting: *Deleted

## 2015-04-07 DIAGNOSIS — Z3A35 35 weeks gestation of pregnancy: Secondary | ICD-10-CM | POA: Insufficient documentation

## 2015-04-07 DIAGNOSIS — O4703 False labor before 37 completed weeks of gestation, third trimester: Secondary | ICD-10-CM | POA: Insufficient documentation

## 2015-04-07 DIAGNOSIS — O479 False labor, unspecified: Secondary | ICD-10-CM

## 2015-04-07 DIAGNOSIS — O288 Other abnormal findings on antenatal screening of mother: Secondary | ICD-10-CM

## 2015-04-07 DIAGNOSIS — O0991 Supervision of high risk pregnancy, unspecified, first trimester: Secondary | ICD-10-CM

## 2015-04-07 LAB — URINALYSIS, ROUTINE W REFLEX MICROSCOPIC
Bilirubin Urine: NEGATIVE
Glucose, UA: NEGATIVE mg/dL
Hgb urine dipstick: NEGATIVE
KETONES UR: NEGATIVE mg/dL
LEUKOCYTES UA: NEGATIVE
NITRITE: NEGATIVE
PH: 5.5 (ref 5.0–8.0)
Protein, ur: NEGATIVE mg/dL
Specific Gravity, Urine: 1.01 (ref 1.005–1.030)

## 2015-04-07 LAB — GC/CHLAMYDIA PROBE AMP (~~LOC~~) NOT AT ARMC
CHLAMYDIA, DNA PROBE: NEGATIVE
Neisseria Gonorrhea: NEGATIVE

## 2015-04-07 LAB — CULTURE, BETA STREP (GROUP B ONLY)

## 2015-04-07 NOTE — MAU Note (Signed)
Pt reports she has been having braxton hicks contractions all day today. Also reports back pain and leg pain earlier. Denies bleeding or ROM.

## 2015-04-07 NOTE — Discharge Instructions (Signed)
Braxton Hicks Contractions °Contractions of the uterus can occur throughout pregnancy. Contractions are not always a sign that you are in labor.  °WHAT ARE BRAXTON HICKS CONTRACTIONS?  °Contractions that occur before labor are called Braxton Hicks contractions, or false labor. Toward the end of pregnancy (32-34 weeks), these contractions can develop more often and may become more forceful. This is not true labor because these contractions do not result in opening (dilatation) and thinning of the cervix. They are sometimes difficult to tell apart from true labor because these contractions can be forceful and people have different pain tolerances. You should not feel embarrassed if you go to the hospital with false labor. Sometimes, the only way to tell if you are in true labor is for your health care provider to look for changes in the cervix. °If there are no prenatal problems or other health problems associated with the pregnancy, it is completely safe to be sent home with false labor and await the onset of true labor. °HOW CAN YOU TELL THE DIFFERENCE BETWEEN TRUE AND FALSE LABOR? °False Labor °· The contractions of false labor are usually shorter and not as hard as those of true labor.   °· The contractions are usually irregular.   °· The contractions are often felt in the front of the lower abdomen and in the groin.   °· The contractions may go away when you walk around or change positions while lying down.   °· The contractions get weaker and are shorter lasting as time goes on.   °· The contractions do not usually become progressively stronger, regular, and closer together as with true labor.   °True Labor °· Contractions in true labor last 30-70 seconds, become very regular, usually become more intense, and increase in frequency.   °· The contractions do not go away with walking.   °· The discomfort is usually felt in the top of the uterus and spreads to the lower abdomen and low back.   °· True labor can be  determined by your health care provider with an exam. This will show that the cervix is dilating and getting thinner.   °WHAT TO REMEMBER °· Keep up with your usual exercises and follow other instructions given by your health care provider.   °· Take medicines as directed by your health care provider.   °· Keep your regular prenatal appointments.   °· Eat and drink lightly if you think you are going into labor.   °· If Braxton Hicks contractions are making you uncomfortable:   °¨ Change your position from lying down or resting to walking, or from walking to resting.   °¨ Sit and rest in a tub of warm water.   °¨ Drink 2-3 glasses of water. Dehydration may cause these contractions.   °¨ Do slow and deep breathing several times an hour.   °WHEN SHOULD I SEEK IMMEDIATE MEDICAL CARE? °Seek immediate medical care if: °· Your contractions become stronger, more regular, and closer together.   °· You have fluid leaking or gushing from your vagina.   °· You have a fever.   °· You pass blood-tinged mucus.   °· You have vaginal bleeding.   °· You have continuous abdominal pain.   °· You have low back pain that you never had before.   °· You feel your baby's head pushing down and causing pelvic pressure.   °· Your baby is not moving as much as it used to.   °  °This information is not intended to replace advice given to you by your health care provider. Make sure you discuss any questions you have with your health care   provider. °  °Document Released: 05/08/2005 Document Revised: 05/13/2013 Document Reviewed: 02/17/2013 °Elsevier Interactive Patient Education ©2016 Elsevier Inc. ° °

## 2015-04-07 NOTE — MAU Note (Cosign Needed)
First Provider Initiated Contact with Patient 04/07/15 0340      Chief Complaint: Ruth Duran  Ruth Duran is  29 y.o. [id number] at 6957w2d presents complaining of Ruth Duran all day, concerned d/t classical incision   Obstetrical/Gynecological History: OB History    Gravida Para Term Preterm AB TAB SAB Ectopic Multiple Living   4 3 1 2      2      Past Medical History: Past Medical History  Diagnosis Date  . Pancreatitis   . Preterm labor   . Gestational diabetes mellitus, antepartum   . Gestational diabetes   . Kidney stone     Past Surgical History: Past Surgical History  Procedure Laterality Date  . Cesarean section      x2    Family History: Family History  Problem Relation Age of Onset  . Diabetes Maternal Grandfather   . Heart attack Father     Social History: Social History  Substance Use Topics  . Smoking status: Former Smoker -- 0.20 packs/day for 4 years    Types: Cigarettes  . Smokeless tobacco: Never Used     Comment: April 2016  . Alcohol Use: No    Allergies: No Known Allergies  Meds:  Facility-administered medications prior to admission  Medication Dose Route Frequency Provider Last Rate Last Dose  . hydroxyprogesterone caproate (DELALUTIN) 250 mg/mL injection 250 mg  250 mg Intramuscular Weekly Dorathy KinsmanVirginia Smith, CNM   250 mg at 04/05/15 1227   Prescriptions prior to admission  Medication Sig Dispense Refill Last Dose  . calcium carbonate (TUMS - DOSED IN MG ELEMENTAL CALCIUM) 500 MG chewable tablet Chew 4 tablets by mouth as needed for indigestion or heartburn.   04/06/2015 at Unknown time  . glucose blood test strip Use 4 times daily 120 each 12 04/06/2015 at Unknown time  . glyBURIDE (DIABETA) 2.5 MG tablet Take 1 tablet (2.5 mg total) by mouth at bedtime. 60 tablet 2 04/06/2015 at Unknown time  . Lancets (ONETOUCH ULTRASOFT) lancets Use 4 times daily 120 each 12 04/06/2015 at Unknown time  . Prenatal Vit-Fe Fumarate-FA (PRENATAL  MULTIVITAMIN) TABS tablet Take 1 tablet by mouth daily at 12 noon.   04/06/2015 at Unknown time    Review of Systems   Constitutional: Negative for fever and chills Eyes: Negative for visual disturbances Respiratory: Negative for shortness of breath, dyspnea Cardiovascular: Negative for chest pain or palpitations  Gastrointestinal: Negative for vomiting, diarrhea and constipation Genitourinary: Negative for dysuria and urgency Musculoskeletal: Negative for back pain, joint pain, myalgias.  Normal ROM  Neurological: Negative for dizziness and headaches    Physical Exam  Blood pressure 135/78, pulse 79, temperature 98.6 F (37 C), temperature source Oral, resp. rate 18, last menstrual period 07/28/2014, SpO2 100 %, unknown if currently breastfeeding. GENERAL: Well-developed, well-nourished female in no acute distress.  LUNGS: Clear to auscultation bilaterally.  HEART: Regular rate and rhythm. ABDOMEN: Soft, nontender, nondistended, gravid.  EXTREMITIES: Nontender, no edema, 2+ distal pulses. DTR's 2+ CERVICAL EXAM: Dilatation 0cm   Effacement 0%   Station -2   Presentation: cephalic FHT:  Baseline rate 140  bpm   Variability moderate  Accelerations present   Decelerations none Contractions: Every 0 mins.  Not feeling them now   Labs: Results for orders placed or performed during the hospital encounter of 04/07/15 (from the past 24 hour(s))  Urinalysis, Routine w reflex microscopic (not at [facility])   Collection Time: 04/07/15  3:19 AM  Result Value Ref Range  Color, Urine YELLOW YELLOW   APPearance CLEAR CLEAR   Specific Gravity, Urine 1.010 1.005 - 1.030   pH 5.5 5.0 - 8.0   Glucose, UA NEGATIVE NEGATIVE mg/dL   Hgb urine dipstick NEGATIVE NEGATIVE   Bilirubin Urine NEGATIVE NEGATIVE   Ketones, ur NEGATIVE NEGATIVE mg/dL   Protein, ur NEGATIVE NEGATIVE mg/dL   Nitrite NEGATIVE NEGATIVE   Leukocytes, UA NEGATIVE NEGATIVE   Imaging Studies:  [place] Mfm Fetal Bpp Wo Non  Stress  03/16/2015  OBSTETRICAL ULTRASOUND: This exam was performed within a [facility]. The OB US report was generated in the AS system, and faxed to the ordering physician.  This report is available in the [employer]. See the AS Obstetric US report via the Image Link.  [place] Mfm Ob Follow Up  03/30/2015  OBSTETRICAL ULTRASOUND: This exam was performed within a [facility]. The OB US report was generated in the AS system, and faxed to the ordering physician.  This report is available in the [employer]. See the AS Obstetric US report via the Image Link.  [place] Mfm Ob Limited  04/05/2015  OBSTETRICAL ULTRASOUND: This exam was performed within a [facility]. The OB US report was generated in the AS system, and faxed to the ordering physician.  This report is available in the [employer]. See the AS Obstetric US report via the Image Link.   Assessment: Ruth Duran is  29 y.o. [id number] at [redacted]w[redacted]d [facility]  Plan: DC home  CRESENZO-DISHMAN,Artesia Berkey 11/16/20163:47 AM

## 2015-04-08 ENCOUNTER — Other Ambulatory Visit: Payer: Commercial Managed Care - PPO | Admitting: Obstetrics & Gynecology

## 2015-04-08 ENCOUNTER — Ambulatory Visit: Payer: Commercial Managed Care - PPO | Admitting: *Deleted

## 2015-04-08 ENCOUNTER — Encounter: Payer: Self-pay | Admitting: *Deleted

## 2015-04-08 VITALS — BP 138/88 | HR 95 | Wt 243.0 lb

## 2015-04-08 DIAGNOSIS — O24419 Gestational diabetes mellitus in pregnancy, unspecified control: Secondary | ICD-10-CM

## 2015-04-12 ENCOUNTER — Encounter (HOSPITAL_COMMUNITY): Payer: Self-pay | Admitting: *Deleted

## 2015-04-12 ENCOUNTER — Ambulatory Visit (INDEPENDENT_AMBULATORY_CARE_PROVIDER_SITE_OTHER): Payer: Commercial Managed Care - PPO | Admitting: Advanced Practice Midwife

## 2015-04-12 ENCOUNTER — Encounter: Payer: Self-pay | Admitting: Obstetrics & Gynecology

## 2015-04-12 VITALS — BP 119/74 | HR 95

## 2015-04-12 DIAGNOSIS — Z3A36 36 weeks gestation of pregnancy: Secondary | ICD-10-CM

## 2015-04-12 DIAGNOSIS — O09213 Supervision of pregnancy with history of pre-term labor, third trimester: Secondary | ICD-10-CM | POA: Diagnosis not present

## 2015-04-12 DIAGNOSIS — Z8751 Personal history of pre-term labor: Secondary | ICD-10-CM

## 2015-04-12 DIAGNOSIS — O24419 Gestational diabetes mellitus in pregnancy, unspecified control: Secondary | ICD-10-CM

## 2015-04-12 DIAGNOSIS — O24414 Gestational diabetes mellitus in pregnancy, insulin controlled: Secondary | ICD-10-CM

## 2015-04-12 NOTE — Progress Notes (Signed)
Subjective:  Ruth Duran is a 29 y.o. [id number] at 8338w0d being seen today for ongoing prenatal care.  She is currently monitored for the following issues for this high-risk pregnancy:  Not checking sugars consistently.  FBS:  [phone] 2 hour PC:  [phone] B                     135-106 L                      [phone] D   Patient Active Problem List   Diagnosis Date Noted  . AFI (amniotic fluid index) borderline low 03/03/2015  . Gestational diabetes mellitus in third trimester 02/09/2015  . H/O: C-section 12/25/2014  . Cervical insufficiency during pregnancy in second trimester, antepartum   . History of preterm premature rupture of membranes (PROM) in previous pregnancy, currently pregnant   . History of preterm premature rupture of membranes (PROM) in previous pregnancy, currently pregnant in first trimester 09/21/2014  . History of cesarean section complicating pregnancy 09/21/2014  . Supervision of high risk pregnancy in first trimester 09/21/2014  . History of placenta abruption 05/08/2013  . PANCREATITIS, HX OF 03/18/2009   Patient reports pelvic pressure.  Contractions: Irregular. Vag. Bleeding: None.  Movement: Present. Denies leaking of fluid.   The following portions of the patient's history were reviewed and updated as appropriate: allergies, current medications, past family history, past medical history, past social history, past surgical history and problem list. Problem list updated.  Objective:   Filed Vitals:   04/12/15 1117  BP: 119/74  Pulse: 95    Fetal Status: Fetal Heart Rate (bpm): NST   Movement: Present     General:  Alert, oriented and cooperative. Patient is in no acute distress.  Skin: Skin is warm and dry. No rash noted.   Cardiovascular: Normal heart rate noted  Respiratory: Normal respiratory effort, no problems with respiration noted  Abdomen: Soft, gravid, appropriate for gestational age. Pain/Pressure: Present     Pelvic: Vag.  Bleeding: None Vag D/C Character: Thin   Cervical exam performed        Extremities: Normal range of motion.  Edema: Trace  Mental Status: Normal mood and affect. Normal behavior. Normal judgment and thought content.   Urinalysis: Urine Protein: Negative Urine Glucose: Negative  Assessment and Plan:  Pregnancy: [id number] at 638w0d Diabetes:   Discussed with Dr Adrian BlackwaterStinson                    Will increase nighttime Glyburide to 5mg  and add morning dose of 2.5mg                     Discussed testing, diet, management and reviewed risk of stillbirth even with mild elevations of sugars. Patient promises to check more often  Will go to MAU Thursday for her BPP since office closed  Preterm labor symptoms and general obstetric precautions including but not limited to vaginal bleeding, contractions, leaking of fluid and fetal movement were reviewed in detail with the patient. Please refer to After Visit Summary for other counseling recommendations.   [facility]  Kyani Simkin L Augusten Lipkin, CNM

## 2015-04-12 NOTE — Patient Instructions (Signed)

## 2015-04-13 ENCOUNTER — Encounter: Payer: Self-pay | Admitting: *Deleted

## 2015-04-13 ENCOUNTER — Inpatient Hospital Stay (HOSPITAL_COMMUNITY)
Admission: AD | Admit: 2015-04-13 | Discharge: 2015-04-13 | Discharge status: Against Medical Advice | Payer: Commercial Managed Care - PPO | Source: Ambulatory Visit | Attending: Family Medicine | Admitting: Family Medicine

## 2015-04-13 ENCOUNTER — Encounter (HOSPITAL_COMMUNITY): Payer: Self-pay | Admitting: *Deleted

## 2015-04-13 ENCOUNTER — Other Ambulatory Visit (INDEPENDENT_AMBULATORY_CARE_PROVIDER_SITE_OTHER): Payer: Commercial Managed Care - PPO | Admitting: *Deleted

## 2015-04-13 VITALS — BP 128/80 | HR 82 | Resp 16

## 2015-04-13 DIAGNOSIS — Z3A36 36 weeks gestation of pregnancy: Secondary | ICD-10-CM | POA: Diagnosis not present

## 2015-04-13 DIAGNOSIS — O1203 Gestational edema, third trimester: Secondary | ICD-10-CM | POA: Insufficient documentation

## 2015-04-13 DIAGNOSIS — O288 Other abnormal findings on antenatal screening of mother: Secondary | ICD-10-CM

## 2015-04-13 DIAGNOSIS — O0991 Supervision of high risk pregnancy, unspecified, first trimester: Secondary | ICD-10-CM

## 2015-04-13 DIAGNOSIS — M25532 Pain in left wrist: Secondary | ICD-10-CM | POA: Diagnosis not present

## 2015-04-13 DIAGNOSIS — M7989 Other specified soft tissue disorders: Secondary | ICD-10-CM | POA: Diagnosis not present

## 2015-04-13 DIAGNOSIS — M25531 Pain in right wrist: Secondary | ICD-10-CM

## 2015-04-13 LAB — URINALYSIS, ROUTINE W REFLEX MICROSCOPIC
BILIRUBIN URINE: NEGATIVE
GLUCOSE, UA: NEGATIVE mg/dL
HGB URINE DIPSTICK: NEGATIVE
Ketones, ur: NEGATIVE mg/dL
Leukocytes, UA: NEGATIVE
Nitrite: NEGATIVE
PROTEIN: NEGATIVE mg/dL
SPECIFIC GRAVITY, URINE: 1.01 (ref 1.005–1.030)
pH: 6.5 (ref 5.0–8.0)

## 2015-04-13 LAB — POCT URINALYSIS DIPSTICK
BILIRUBIN UA: NEGATIVE
Blood, UA: NEGATIVE
Glucose, UA: NEGATIVE
LEUKOCYTES UA: NEGATIVE
Nitrite, UA: NEGATIVE
SPEC GRAV UA: 1.02
Urobilinogen, UA: 0.2
pH, UA: 6.5

## 2015-04-13 NOTE — MAU Note (Signed)
Pt reports a really bad headache this pm and a 3 lb weight gain in the last week.

## 2015-04-13 NOTE — Progress Notes (Signed)
Patient ID: Ruth AlmasKaylee Duran, female   DOB: Mar 09, 1986, 29 y.o.   MRN: [id number] Agree with nurses's documentation of this patient's clinic encounter.  Catalina AntiguaPeggy Xzaviar Maloof, MD

## 2015-04-13 NOTE — MAU Note (Signed)
PT  SAYS SHE WAS    WORRIED  THAT   SHE HAS GAINED 3 LBS  THIS  WEEK  AND  NOW HAS A H/A.     UA IN OFFICE   WAS  NEG..        H/A STARTED   430PM-    NO MEDS.   [facility]-  [place].    DENIES HSV AND MRSA.    GBS-   UNSURE

## 2015-04-13 NOTE — Progress Notes (Signed)
Obstetric History and Physical  Ruth AlmasKaylee Duran is a 29 y.o. [id number] with IUP at 4975w1d presenting for LE and Hands swelling, headache, and bilateral hand pain . Patient states she has been having no contractions, none vaginal bleeding, with active fetal movement.   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 She states the "carpal tunnel" is causing too much pain to type.   Prenatal Course  Pregnancy complications or risks: Patient Active Problem List   Diagnosis Date Noted  . AFI (amniotic fluid index) borderline low 03/03/2015  . Gestational diabetes mellitus in third trimester 02/09/2015  . H/O: C-section 12/25/2014  . Cervical insufficiency during pregnancy in second trimester, antepartum   . History of preterm premature rupture of membranes (PROM) in previous pregnancy, currently pregnant   . History of preterm premature rupture of membranes (PROM) in previous pregnancy, currently pregnant in first trimester 09/21/2014  . History of cesarean section complicating pregnancy 09/21/2014  . Supervision of high risk pregnancy in first trimester 09/21/2014  . History of placenta abruption 05/08/2013  . PANCREATITIS, HX OF 03/18/2009    Prenatal labs and studies: ABO, Rh: B/POS/-- (05/02 1451) Antibody: NEG (05/02 1451) Rubella: 1.59 (05/02 1451) RPR: NON REAC (09/19 0907)  HBsAg: NEGATIVE (05/02 1451)  HIV: NONREACTIVE (09/19 0907)  GBS: Negative  Pertinent Labs/Studies:   Results for orders placed or performed during the hospital encounter of 04/13/15 (from the past 24 hour(s))  Urinalysis, Routine w reflex microscopic (not at [facility])     Status: None   Collection Time: 04/13/15  1:33 AM  Result Value Ref Range   Color, Urine YELLOW YELLOW   APPearance CLEAR CLEAR   Specific Gravity, Urine 1.010 1.005 - 1.030   pH 6.5 5.0 - 8.0   Glucose,  UA NEGATIVE NEGATIVE mg/dL   Hgb urine dipstick NEGATIVE NEGATIVE   Bilirubin Urine NEGATIVE NEGATIVE   Ketones, ur NEGATIVE NEGATIVE mg/dL   Protein, ur NEGATIVE NEGATIVE mg/dL   Nitrite NEGATIVE NEGATIVE   Leukocytes, UA NEGATIVE NEGATIVE    Assessment:  Filed Vitals:   04/13/15 0824  BP: 128/80  Pulse: 82  Resp: 16   Urine dip completed in office Results for Ruth AlmasLATON, Ernesto (MRN [id number]) as of 04/13/2015 08:37  Ref. Range 04/13/2015 08:31  Bilirubin, UA Unknown Neg  Clarity, UA Unknown Clear  Color, UA Unknown Yellow  Glucose Unknown Neg  Ketones, UA Unknown Small  Leukocytes, UA Latest Ref Range: Negative  Negative  Nitrite, UA Unknown Neg  pH, UA Unknown 6.5  Protein, UA Unknown Trace  RBC, UA Unknown Neg  Specific Gravity, UA Unknown 1.020  Urobilinogen, UA Unknown 0.2   Noted +1 LE edema, +1 bilateral UE edema  Plan: Urine Protein/Creatine Ratio Urine Culture Recommended wrist braces Tylenol for headache Elevate LE  Rest Work excuse given to patient for today only Recommended speaking with provider at next visit about work restrictions - work excuse given 03/22/15, 04/08/15, 04/13/15

## 2015-04-14 LAB — PROTEIN / CREATININE RATIO, URINE
Creatinine, Urine: 122 mg/dL (ref 20–320)
Protein Creatinine Ratio: 131 mg/g creat (ref 21–161)
Total Protein, Urine: 16 mg/dL (ref 5–24)

## 2015-04-15 ENCOUNTER — Inpatient Hospital Stay (HOSPITAL_COMMUNITY): Payer: Commercial Managed Care - PPO

## 2015-04-15 ENCOUNTER — Inpatient Hospital Stay (HOSPITAL_COMMUNITY)
Admission: AD | Admit: 2015-04-15 | Discharge: 2015-04-15 | Disposition: A | Discharge status: Home / Self Care | Payer: Commercial Managed Care - PPO | Source: Ambulatory Visit | Attending: Obstetrics & Gynecology | Admitting: Obstetrics & Gynecology

## 2015-04-15 DIAGNOSIS — O09293 Supervision of pregnancy with other poor reproductive or obstetric history, third trimester: Secondary | ICD-10-CM | POA: Diagnosis not present

## 2015-04-15 DIAGNOSIS — Z3A36 36 weeks gestation of pregnancy: Secondary | ICD-10-CM | POA: Insufficient documentation

## 2015-04-15 DIAGNOSIS — O24415 Gestational diabetes mellitus in pregnancy, controlled by oral hypoglycemic drugs: Secondary | ICD-10-CM | POA: Diagnosis not present

## 2015-04-15 DIAGNOSIS — O34219 Maternal care for unspecified type scar from previous cesarean delivery: Secondary | ICD-10-CM | POA: Insufficient documentation

## 2015-04-15 DIAGNOSIS — O288 Other abnormal findings on antenatal screening of mother: Secondary | ICD-10-CM

## 2015-04-15 LAB — CULTURE, OB URINE: Colony Count: 65000

## 2015-04-15 NOTE — Progress Notes (Signed)
Dr Marcy PanningMikelll notified BPP 8/8

## 2015-04-17 ENCOUNTER — Other Ambulatory Visit: Payer: Self-pay | Admitting: Family Medicine

## 2015-04-19 ENCOUNTER — Encounter: Payer: Self-pay | Admitting: Obstetrics & Gynecology

## 2015-04-19 ENCOUNTER — Ambulatory Visit (INDEPENDENT_AMBULATORY_CARE_PROVIDER_SITE_OTHER): Payer: Commercial Managed Care - PPO | Admitting: Advanced Practice Midwife

## 2015-04-19 VITALS — BP 121/78 | HR 104

## 2015-04-19 DIAGNOSIS — O34219 Maternal care for unspecified type scar from previous cesarean delivery: Secondary | ICD-10-CM

## 2015-04-19 DIAGNOSIS — O0991 Supervision of high risk pregnancy, unspecified, first trimester: Secondary | ICD-10-CM

## 2015-04-19 DIAGNOSIS — O0993 Supervision of high risk pregnancy, unspecified, third trimester: Secondary | ICD-10-CM

## 2015-04-19 DIAGNOSIS — O24414 Gestational diabetes mellitus in pregnancy, insulin controlled: Secondary | ICD-10-CM

## 2015-04-19 NOTE — Patient Instructions (Signed)
Cesarean Delivery Cesarean delivery is the birth of a baby through a cut (incision) in the abdomen and womb (uterus).  LET YOUR HEALTH CARE PROVIDER KNOW ABOUT:  All medicines you are taking, including vitamins, herbs, eye drops, creams, and over-the-counter medicines.  Previous problems you or members of your family have had with the use of anesthetics.  Any bleeding or blood clotting disorders you have.  Family history of blood clots or bleeding disorders.  Any history of deep vein thrombosis (DVT) or pulmonary embolism (PE).  Previous surgeries you have had.  Medical conditions you have.  Any allergies you have.  Complicationsinvolving the pregnancy. RISKS AND COMPLICATIONS  Generally, this is a safe procedure. However, as with any procedure, complications can occur. Possible complications include:  Bleeding.  Infection.  Blood clots.  Injury to surrounding organs.  Problems with anesthesia.  Injury to the baby. BEFORE THE PROCEDURE   You may be given an antacid medicine to drink. This will prevent acid contents in your stomach from going into your lungs if you vomit during the surgery.  You may be given an antibiotic medicine to prevent infection. PROCEDURE   To prevent infection of your incision:  Hair may be removed from your pubic area if it is near your incision.  The skin of your pubic area and lower abdomen will be cleaned with a germ-killing solution (antiseptic).  A tube (Foley catheter) will be placed in your bladder to drain your urine from your bladder into a bag. This keeps your bladder empty during surgery.  An IV tube will be placed in your vein.  You may be given medicine to numb the lower half of your body (regional anesthetic). If you were in labor, you may have already had an epidural in place which can be used in both labor and cesarean delivery. You may possibly be given medicine to make you sleep (general anesthetic) though this is not as  common.  Your heart rate and your baby's heart rate will be monitored.  An incision will be made in your abdomen that extends to your uterus. There are 2 basic kinds of incisions:  The horizontal (transverse) incision. Horizontal incisions are from side to side and are used for most routine cesarean deliveries.  The vertical incision. The vertical incision is from the top of the abdomen to the bottom and is less commonly used. It is often done for women who have a serious complication (extreme prematurity) or under emergency situations.  The horizontal and vertical incisions may both be used at the same time. However, this is very uncommon.  An incision is then made in your uterus to deliver the baby.  Your baby will be delivered.  Your health care provider may place the baby on your chest. It is important to keep the baby warm. Your health care provider will dry off the baby, place the baby directly on your bare skin, and cover the baby with warm, dry blankets.  Both incisions will be closed with absorbable stitches. AFTER THE PROCEDURE   If you were awake during the surgery, you will see your baby right away. If you were asleep, you will see your baby as soon as you are awake.  You may breastfeed your baby after surgery.  You may be able to get up and walk the same day as the surgery. If you need to stay in bed for a period of time, you will receive help to turn, cough, and take deep breaths after   surgery. This helps prevent lung problems such as pneumonia.  Do not get out of bed alone the first time after surgery. You will need help getting out of bed until you are able to do this by yourself.  You may be able to shower the day after your cesarean delivery. After the bandage (dressing) is taken off the incision site, a nurse will assist you to shower if you would like help.  You may be directed to take actions to help prevent blood clots in your legs. These may  include:  Walking shortly after surgery, with someone assisting you. Moving around after surgery helps to improve blood flow.  Wearing compression stockings or using different types of devices.  Taking medicines to thin your blood (anticoagulants) if you are at high risk for DVT or PE.  Save any blood clots that you pass from your vagina. If you pass a clot while on the toilet, do not flush it. Call for the nurse. Tell the nurse if you think you are bleeding too much or passing too many clots.  You will be given medicine for pain and nausea as needed. Let your health care providers know if you are hurting. You may also be given an antibiotic to prevent an infection.  Your IV tube will be taken out when you are drinking a reasonable amount of fluids. The Foley catheter is taken out when you are up and walking.  If your blood type is Rh negative and your baby's blood type is Rh positive, you will be given a shot of anti-D immune globulin. This shot prevents you from having Rh problems with a future pregnancy. You should get the shot even if you had your tubes tied (tubal ligation).  If you are allowed to take the baby for a walk, place the baby in the bassinet and push it.   This information is not intended to replace advice given to you by your health care provider. Make sure you discuss any questions you have with your health care provider.   Document Released: 05/08/2005 Document Revised: 01/27/2015 Document Reviewed: 01/03/2012 Elsevier Interactive Patient Education 2016 Elsevier Inc.  

## 2015-04-19 NOTE — Progress Notes (Signed)
Subjective:  Ruth Duran is a 29 y.o. [id number] at 4120w0d being seen today for ongoing prenatal care.  She is currently monitored for the following issues for this high-risk pregnancy and has PANCREATITIS, HX OF; History of placenta abruption; History of preterm premature rupture of membranes (PROM) in previous pregnancy, currently pregnant in first trimester; History of cesarean section complicating pregnancy; Supervision of high risk pregnancy in first trimester; History of preterm premature rupture of membranes (PROM) in previous pregnancy, currently pregnant; Cervical insufficiency during pregnancy in second trimester, antepartum; H/O: C-section; Gestational diabetes mellitus in third trimester; and AFI (amniotic fluid index) borderline low on her problem list.  Patient reports no complaints.  Contractions: Irregular. Vag. Bleeding: None.  Movement: Present. Denies leaking of fluid.   The following portions of the patient's history were reviewed and updated as appropriate: allergies, current medications, past family history, past medical history, past social history, past surgical history and problem list. Problem list updated.  Objective:   Fasting CBGs: 69-73 (0% out of range) PCB:  Not testing after meals   Filed Vitals:   04/19/15 1117  BP: 121/78  Pulse: 104    Fetal Status: Fetal Heart Rate (bpm): NST   Movement: Present     General:  Alert, oriented and cooperative. Patient is in no acute distress.  Skin: Skin is warm and dry. No rash noted.   Cardiovascular: Normal heart rate noted  Respiratory: Normal respiratory effort, no problems with respiration noted  Abdomen: Soft, gravid, appropriate for gestational age. Pain/Pressure: Present     Pelvic: Vag. Bleeding: None Vag D/C Character: Thin   Cervical exam deferred        Extremities: Normal range of motion.  Edema: Trace  Mental Status: Normal mood and affect. Normal behavior. Normal judgment and thought content.   NST  reactive  Urinalysis: Urine Protein: Negative Urine Glucose: Negative  Assessment and Plan:  Pregnancy: A5W0981G4P1202 at 6820w0d  There are no diagnoses linked to this encounter. Term labor symptoms and general obstetric precautions including but not limited to vaginal bleeding, contractions, leaking of fluid and fetal movement were reviewed in detail with the patient. Please refer to After Visit Summary for other counseling recommendations.  F/U antenatal testing Growth US 04/20/15 Encouraged to test CBGs after meals  Dorathy KinsmanVirginia Sharronda Schweers [place], CNM

## 2015-04-20 ENCOUNTER — Other Ambulatory Visit (HOSPITAL_COMMUNITY): Payer: Self-pay | Admitting: Maternal and Fetal Medicine

## 2015-04-20 ENCOUNTER — Inpatient Hospital Stay (HOSPITAL_COMMUNITY)
Admission: AD | Admit: 2015-04-20 | Discharge: 2015-04-20 | Disposition: A | Discharge status: Home / Self Care | Payer: Commercial Managed Care - PPO | Source: Ambulatory Visit | Attending: Obstetrics & Gynecology | Admitting: Obstetrics & Gynecology

## 2015-04-20 ENCOUNTER — Encounter (HOSPITAL_COMMUNITY): Payer: Self-pay | Admitting: *Deleted

## 2015-04-20 ENCOUNTER — Encounter (HOSPITAL_COMMUNITY): Payer: Self-pay

## 2015-04-20 ENCOUNTER — Ambulatory Visit (HOSPITAL_COMMUNITY)
Admission: RE | Admit: 2015-04-20 | Discharge: 2015-04-20 | Disposition: A | Discharge status: Home / Self Care | Payer: Commercial Managed Care - PPO | Source: Ambulatory Visit | Attending: Obstetrics & Gynecology | Admitting: Obstetrics & Gynecology

## 2015-04-20 DIAGNOSIS — O479 False labor, unspecified: Secondary | ICD-10-CM

## 2015-04-20 DIAGNOSIS — Z87891 Personal history of nicotine dependence: Secondary | ICD-10-CM | POA: Diagnosis not present

## 2015-04-20 DIAGNOSIS — O471 False labor at or after 37 completed weeks of gestation: Secondary | ICD-10-CM

## 2015-04-20 DIAGNOSIS — O24415 Gestational diabetes mellitus in pregnancy, controlled by oral hypoglycemic drugs: Secondary | ICD-10-CM | POA: Insufficient documentation

## 2015-04-20 DIAGNOSIS — Z3A37 37 weeks gestation of pregnancy: Secondary | ICD-10-CM | POA: Insufficient documentation

## 2015-04-20 DIAGNOSIS — O34219 Maternal care for unspecified type scar from previous cesarean delivery: Secondary | ICD-10-CM

## 2015-04-20 DIAGNOSIS — O09299 Supervision of pregnancy with other poor reproductive or obstetric history, unspecified trimester: Secondary | ICD-10-CM

## 2015-04-20 DIAGNOSIS — Z8751 Personal history of pre-term labor: Secondary | ICD-10-CM

## 2015-04-20 DIAGNOSIS — O99213 Obesity complicating pregnancy, third trimester: Secondary | ICD-10-CM

## 2015-04-20 DIAGNOSIS — E669 Obesity, unspecified: Secondary | ICD-10-CM | POA: Insufficient documentation

## 2015-04-20 MED ORDER — GLYBURIDE 2.5 MG PO TABS
ORAL_TABLET | ORAL | Status: DC
Start: 2015-04-20 — End: 2015-05-06

## 2015-04-20 NOTE — Discharge Instructions (Signed)
Braxton Hicks Contractions °Contractions of the uterus can occur throughout pregnancy. Contractions are not always a sign that you are in labor.  °WHAT ARE BRAXTON HICKS CONTRACTIONS?  °Contractions that occur before labor are called Braxton Hicks contractions, or false labor. Toward the end of pregnancy (32-34 weeks), these contractions can develop more often and may become more forceful. This is not true labor because these contractions do not result in opening (dilatation) and thinning of the cervix. They are sometimes difficult to tell apart from true labor because these contractions can be forceful and people have different pain tolerances. You should not feel embarrassed if you go to the hospital with false labor. Sometimes, the only way to tell if you are in true labor is for your health care provider to look for changes in the cervix. °If there are no prenatal problems or other health problems associated with the pregnancy, it is completely safe to be sent home with false labor and await the onset of true labor. °HOW CAN YOU TELL THE DIFFERENCE BETWEEN TRUE AND FALSE LABOR? °False Labor °· The contractions of false labor are usually shorter and not as hard as those of true labor.   °· The contractions are usually irregular.   °· The contractions are often felt in the front of the lower abdomen and in the groin.   °· The contractions may go away when you walk around or change positions while lying down.   °· The contractions get weaker and are shorter lasting as time goes on.   °· The contractions do not usually become progressively stronger, regular, and closer together as with true labor.   °True Labor °· Contractions in true labor last 30-70 seconds, become very regular, usually become more intense, and increase in frequency.   °· The contractions do not go away with walking.   °· The discomfort is usually felt in the top of the uterus and spreads to the lower abdomen and low back.   °· True labor can be  determined by your health care provider with an exam. This will show that the cervix is dilating and getting thinner.   °WHAT TO REMEMBER °· Keep up with your usual exercises and follow other instructions given by your health care provider.   °· Take medicines as directed by your health care provider.   °· Keep your regular prenatal appointments.   °· Eat and drink lightly if you think you are going into labor.   °· If Braxton Hicks contractions are making you uncomfortable:   °¨ Change your position from lying down or resting to walking, or from walking to resting.   °¨ Sit and rest in a tub of warm water.   °¨ Drink 2-3 glasses of water. Dehydration may cause these contractions.   °¨ Do slow and deep breathing several times an hour.   °WHEN SHOULD I SEEK IMMEDIATE MEDICAL CARE? °Seek immediate medical care if: °· Your contractions become stronger, more regular, and closer together.   °· You have fluid leaking or gushing from your vagina.   °· You have a fever.   °· You pass blood-tinged mucus.   °· You have vaginal bleeding.   °· You have continuous abdominal pain.   °· You have low back pain that you never had before.   °· You feel your baby's head pushing down and causing pelvic pressure.   °· Your baby is not moving as much as it used to.   °  °This information is not intended to replace advice given to you by your health care provider. Make sure you discuss any questions you have with your health care   provider. °  °Document Released: 05/08/2005 Document Revised: 05/13/2013 Document Reviewed: 02/17/2013 °Elsevier Interactive Patient Education ©2016 Elsevier Inc. ° °

## 2015-04-20 NOTE — MAU Provider Note (Signed)
Chief Complaint:  Contractions   First Provider Initiated Contact with Patient 04/20/15 1417     HPI: Ruth Duran is a 29 y.o. [id number] at 8094w1d who presents to maternity admissions reporting contractions. 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   Location: low abd  Quality: cramping and tightening. Severity: 5/10 in pain scale Duration: today Context: None Timing: intermittent Modifying factors: none Associated signs and symptoms: Neg for fever, chills, LOF, VB. Good fetal movement.   Past Medical History: Past Medical History  Diagnosis Date  . Pancreatitis   . Preterm labor   . Gestational diabetes mellitus, antepartum   . Gestational diabetes   . Kidney stone     Past obstetric history: OB History  Gravida Para Term Preterm AB SAB TAB Ectopic Multiple Living  4 3 1 2      2     # Outcome Date GA Lbr Len/2nd Weight Sex Delivery Anes PTL Lv  4 Current           3 Preterm 09/05/13 241w4d  2 lb 2 oz (0.964 kg) M CS-Unspec  Y ND     Complications: Abruptio Placenta,Preterm premature rupture of membranes (PPROM) delivered, current hospitalization  2 Preterm 2009 7048w0d  5 lb 2 oz (2.325 kg) M CS-LTranv EPI Y Y     Comments: PPROM  1 Term 2007 6721w0d  6 lb 7 oz (2.92 kg)  CS-LTranv EPI N Y     Comments: abruption at term--c/s      Past Surgical History: Past Surgical History  Procedure Laterality Date  . Cesarean section      x2     Family History: Family History  Problem Relation Age of Onset  . Diabetes Maternal Grandfather   . Heart attack Father     Social History: Social History  Substance Use Topics  . Smoking status: Former Smoker -- 0.20 packs/day for 4 years    Types: Cigarettes  . Smokeless tobacco: Never Used     Comment: April 2016  . Alcohol Use: No    Allergies: No Known Allergies  Meds:  Prescriptions prior to admission  Medication Sig Dispense Refill Last Dose   . calcium carbonate (TUMS - DOSED IN MG ELEMENTAL CALCIUM) 500 MG chewable tablet Chew 4 tablets by mouth daily as needed for indigestion or heartburn.    04/19/2015 at Unknown time  . Prenatal Vit-Fe Fumarate-FA (PRENATAL MULTIVITAMIN) TABS tablet Take 1 tablet by mouth daily at 12 noon.   04/19/2015 at Unknown time  . [DISCONTINUED] glyBURIDE (DIABETA) 2.5 MG tablet Take 1 tablet (2.5 mg total) by mouth at bedtime. (Patient taking differently: Take 5 mg by mouth at bedtime. Take 2 tablets daily at bedtime.) 60 tablet 2 04/19/2015 at Unknown time  . glucose blood test strip Use 4 times daily 120 each 12 Taking  . Lancets (ONETOUCH ULTRASOFT) lancets Use 4 times daily 120 each 12 Taking    I have reviewed patient's Past Medical Hx, Surgical Hx, Family Hx, Social Hx, medications and allergies.   ROS:  Review of Systems  Constitutional: Negative for fever and chills.  Gastrointestinal: Positive for abdominal pain (contractions only).  Genitourinary: Negative for vaginal bleeding.       Neg for LOF    Physical Exam  Patient Vitals for the past 24 hrs:  BP Pulse Resp  04/20/15 1425 122/81 mmHg 97 20   Constitutional: Well-developed, well-nourished  female in no acute distress.  Cardiovascular: normal rate Respiratory: normal effort GI: Abd soft, non-tender, gravid appropriate for gestational age. MS: Extremities nontender, no edema, normal ROM Neurologic: Alert and oriented x 4.  GU: Neg CVAT.  Pelvic: NEFG, physiologic discharge, no blood, cervix clean. No CMT  Dilation: 1 Effacement (%): Thick Exam by:: Ivonne Andrew, CNM  FHT:  Baseline 140 , moderate variability, accelerations present, no decelerations Contractions: q 4-10 mins, mild   Labs: No results found for this or any previous visit (from the past 24 hour(s)).  Imaging:  Growth [place] today in MFM. AFI 10 cm.    MAU Course: Prolonged monitoring due to history of C-section 3. NPO.   No cervical change. Contractions mildly  irregular. Discussed history, contractions with Dr. Macon Large. No indication for proceeding with delivery at this time.  MDM: 29 year old female 37 weeks 1 day gestation with false labor. No indication for proceeding with delivery at this time.  Assessment: 1. False labor   2. Previous cesarean delivery affecting pregnancy, antepartum     Plan: Discharge home in stable condition.  Labor precautions and fetal kick counts Uterine rupture precautions. Follow-up Information    Follow up with [facility] [place] On 04/22/2015.   Specialty:  Obstetrics and Gynecology   Why:  Routine prenatal visit   Contact information:   [address] [place] [address] [phone]      Follow up with [facility] [place] [facility] ADMISS[facility].   Why:  As needed if symptoms worsen   Contact information:   [address] [id number] mc [place] [address] [phone]        Medication List    TAKE these medications        calcium carbonate 500 MG chewable tablet  Commonly known as:  TUMS - dosed in mg elemental calcium  Chew 4 tablets by mouth daily as needed for indigestion or heartburn.     glucose blood test strip  Use 4 times daily     glyBURIDE 2.5 MG tablet  Commonly known as:  DIABETA  Take 1 tablet (2.5 mg) daily in the morning and 2 tablets (5 mg) at bedtime     onetouch ultrasoft lancets  Use 4 times daily     prenatal multivitamin Tabs tablet  Take 1 tablet by mouth daily at 12 noon.        [place], CNM 04/20/2015 4:54 PM

## 2015-04-20 NOTE — MAU Note (Addendum)
Contractions, abdomen very tight. States she does not really feel pain, just "a lot of discomfort." For repeat C/S.

## 2015-04-22 ENCOUNTER — Ambulatory Visit (INDEPENDENT_AMBULATORY_CARE_PROVIDER_SITE_OTHER): Payer: Commercial Managed Care - PPO | Admitting: *Deleted

## 2015-04-22 ENCOUNTER — Encounter: Payer: Self-pay | Admitting: Obstetrics & Gynecology

## 2015-04-22 VITALS — Wt 247.0 lb

## 2015-04-22 DIAGNOSIS — O24414 Gestational diabetes mellitus in pregnancy, insulin controlled: Secondary | ICD-10-CM

## 2015-04-26 ENCOUNTER — Ambulatory Visit (INDEPENDENT_AMBULATORY_CARE_PROVIDER_SITE_OTHER): Payer: Commercial Managed Care - PPO | Admitting: Family

## 2015-04-26 ENCOUNTER — Encounter: Payer: Self-pay | Admitting: *Deleted

## 2015-04-26 VITALS — BP 118/80 | HR 82 | Wt 248.0 lb

## 2015-04-26 DIAGNOSIS — O321XX1 Maternal care for breech presentation, fetus 1: Secondary | ICD-10-CM | POA: Diagnosis not present

## 2015-04-26 DIAGNOSIS — O24419 Gestational diabetes mellitus in pregnancy, unspecified control: Secondary | ICD-10-CM

## 2015-04-26 DIAGNOSIS — O0991 Supervision of high risk pregnancy, unspecified, first trimester: Secondary | ICD-10-CM

## 2015-04-26 LAB — GLUCOSE, POCT (MANUAL RESULT ENTRY): POC Glucose: 74 mg/dl (ref 70–99)

## 2015-04-26 NOTE — Progress Notes (Signed)
Subjective:  Ruth Duran is a 29 y.o. [id number] at 3514w0d being seen today for ongoing prenatal care.  She is currently monitored for the following issues for this high-risk pregnancy and has History of placenta abruption; History of preterm premature rupture of membranes (PROM) in previous pregnancy, History of cesarean section complicating pregnancy; Gestational diabetes mellitus in third trimester..  Patient reports no complaints.  Contractions: Irritability. Vag. Bleeding: None.   . Denies leaking of fluid. Did not bring glucose log.  Last meal 2 hours ago.  The following portions of the patient's history were reviewed and updated as appropriate: allergies, current medications, past family history, past medical history, past social history, past surgical history and problem list. Problem list updated.  Objective:   Filed Vitals:   04/26/15 1056 04/26/15 1141  BP: 118/80 118/80  Pulse: 82 82  Weight: 248 lb (112.492 kg) 248 lb (112.492 kg)    Fetal Status: Fetal Heart Rate (bpm): NST-R Fundal Height: 39 cm    Presentation: Complete Breech  General:  Alert, oriented and cooperative. Patient is in no acute distress.  Skin: Skin is warm and dry. No rash noted.   Cardiovascular: Normal heart rate noted  Respiratory: Normal respiratory effort, no problems with respiration noted  Abdomen: Soft, gravid, appropriate for gestational age.       Pelvic: Vag. Bleeding: None Vag D/C Character: Thin   Cervical exam deferred        Extremities: Normal range of motion.  Edema: Trace  Mental Status: Normal mood and affect. Normal behavior. Normal judgment and thought content.   Urinalysis: Urine Protein: Negative Urine Glucose: Negative  Results for orders placed or performed in visit on 04/26/15 (from the past 24 hour(s))  POCT Glucose (CBG)     Status: Normal   Collection Time: 04/26/15 11:44 AM  Result Value Ref Range   POC Glucose 74 70 - 99 mg/dl    Assessment and Plan:  Pregnancy: [id number]  at 3814w0d  1. Supervision of high risk pregnancy in first trimester  2. Gestational diabetes mellitus in third trimester, unspecified diabetic control - POCT Glucose (CBG) - NST Thursday  3.  History of Abruption - Monitor kick counts - Report any pain or bleeding - Csection scheduled in one week (39 wks)  Term labor symptoms and general obstetric precautions including but not limited to vaginal bleeding, contractions, leaking of fluid and fetal movement were reviewed in detail with the patient. Please refer to After Visit Summary for other counseling recommendations.  Return in about 3 days (around 04/29/2015) for NST.   Eino FarberWalidah Kennith GainN Karim, CNM

## 2015-04-28 ENCOUNTER — Ambulatory Visit (INDEPENDENT_AMBULATORY_CARE_PROVIDER_SITE_OTHER): Payer: Commercial Managed Care - PPO | Admitting: *Deleted

## 2015-04-28 VITALS — BP 128/80 | HR 73

## 2015-04-28 DIAGNOSIS — O24414 Gestational diabetes mellitus in pregnancy, insulin controlled: Secondary | ICD-10-CM | POA: Diagnosis not present

## 2015-04-29 ENCOUNTER — Other Ambulatory Visit: Payer: Commercial Managed Care - PPO

## 2015-04-30 ENCOUNTER — Encounter (HOSPITAL_COMMUNITY)
Admission: RE | Admit: 2015-04-30 | Discharge: 2015-04-30 | Disposition: A | Discharge status: Home / Self Care | Payer: Commercial Managed Care - PPO | Source: Ambulatory Visit | Attending: Family Medicine | Admitting: Family Medicine

## 2015-04-30 LAB — CBC
HCT: 31.7 % — ABNORMAL LOW (ref 36.0–46.0)
HEMOGLOBIN: 10.8 g/dL — AB (ref 12.0–15.0)
MCH: 28.6 pg (ref 26.0–34.0)
MCHC: 34.1 g/dL (ref 30.0–36.0)
MCV: 83.9 fL (ref 78.0–100.0)
Platelets: 211 10*3/uL (ref 150–400)
RBC: 3.78 MIL/uL — AB (ref 3.87–5.11)
RDW: 13.8 % (ref 11.5–15.5)
WBC: 9.9 10*3/uL (ref 4.0–10.5)

## 2015-04-30 LAB — BASIC METABOLIC PANEL
Anion gap: 8 (ref 5–15)
BUN: 7 mg/dL (ref 6–20)
CALCIUM: 8.9 mg/dL (ref 8.9–10.3)
CHLORIDE: 105 mmol/L (ref 101–111)
CO2: 24 mmol/L (ref 22–32)
CREATININE: 0.66 mg/dL (ref 0.44–1.00)
Glucose, Bld: 102 mg/dL — ABNORMAL HIGH (ref 65–99)
Potassium: 3.4 mmol/L — ABNORMAL LOW (ref 3.5–5.1)
SODIUM: 137 mmol/L (ref 135–145)

## 2015-04-30 LAB — TYPE AND SCREEN
ABO/RH(D): B POS
Antibody Screen: NEGATIVE

## 2015-04-30 NOTE — Patient Instructions (Addendum)
   Your procedure is scheduled on: December 12 (MONDAY)  [facility]  Pick up the phone at the desk and dial [phone] and inform us of your arrival.  Please call this number if you have any problems the morning of surgery: [phone]  Remember: Do not eat food after midnight: December 11 (SUNDAY) Do not drink clear liquids after: 8AM DAY OF SURGERY (MONDAY)  Take these medicines the morning of surgery with a SIP OF WATER: DO NOT TAKE GLYBURIDE DAY OF SURGERY  Do not wear jewelry, make-up, or FINGER nail polish No metal in your hair or on your body. Do not wear lotions, powders, perfumes.  You may wear deodorant.  Do not bring valuables to the hospital. Contacts, dentures or bridgework may not be worn into surgery.  Leave suitcase in the car. After Surgery it may be brought to your room. For patients being admitted to the hospital, checkout time is 11:00am the day of discharge.    .Marland Kitchen

## 2015-05-01 LAB — RPR: RPR: NONREACTIVE

## 2015-05-03 ENCOUNTER — Encounter (HOSPITAL_COMMUNITY)
Admission: RE | Disposition: A | Discharge status: Home / Self Care | Payer: Self-pay | Source: Ambulatory Visit | Attending: Family Medicine

## 2015-05-03 ENCOUNTER — Inpatient Hospital Stay (HOSPITAL_COMMUNITY): Payer: Commercial Managed Care - PPO | Admitting: Anesthesiology

## 2015-05-03 ENCOUNTER — Encounter (HOSPITAL_COMMUNITY): Payer: Self-pay

## 2015-05-03 ENCOUNTER — Inpatient Hospital Stay (HOSPITAL_COMMUNITY)
Admission: RE | Admit: 2015-05-03 | Discharge: 2015-05-06 | DRG: 766 | Disposition: A | Discharge status: Home / Self Care | Payer: Commercial Managed Care - PPO | Source: Ambulatory Visit | Attending: Family Medicine | Admitting: Family Medicine

## 2015-05-03 DIAGNOSIS — Z87891 Personal history of nicotine dependence: Secondary | ICD-10-CM

## 2015-05-03 DIAGNOSIS — Z8249 Family history of ischemic heart disease and other diseases of the circulatory system: Secondary | ICD-10-CM

## 2015-05-03 DIAGNOSIS — O24410 Gestational diabetes mellitus in pregnancy, diet controlled: Secondary | ICD-10-CM

## 2015-05-03 DIAGNOSIS — O34211 Maternal care for low transverse scar from previous cesarean delivery: Principal | ICD-10-CM | POA: Diagnosis present

## 2015-05-03 DIAGNOSIS — O4590 Premature separation of placenta, unspecified, unspecified trimester: Secondary | ICD-10-CM | POA: Diagnosis present

## 2015-05-03 DIAGNOSIS — O288 Other abnormal findings on antenatal screening of mother: Secondary | ICD-10-CM

## 2015-05-03 DIAGNOSIS — O99214 Obesity complicating childbirth: Secondary | ICD-10-CM | POA: Diagnosis present

## 2015-05-03 DIAGNOSIS — Z3490 Encounter for supervision of normal pregnancy, unspecified, unspecified trimester: Secondary | ICD-10-CM

## 2015-05-03 DIAGNOSIS — O328XX0 Maternal care for other malpresentation of fetus, not applicable or unspecified: Secondary | ICD-10-CM | POA: Diagnosis present

## 2015-05-03 DIAGNOSIS — Z98891 History of uterine scar from previous surgery: Secondary | ICD-10-CM

## 2015-05-03 DIAGNOSIS — O24420 Gestational diabetes mellitus in childbirth, diet controlled: Secondary | ICD-10-CM

## 2015-05-03 DIAGNOSIS — Z8759 Personal history of other complications of pregnancy, childbirth and the puerperium: Secondary | ICD-10-CM

## 2015-05-03 DIAGNOSIS — O24419 Gestational diabetes mellitus in pregnancy, unspecified control: Secondary | ICD-10-CM | POA: Diagnosis present

## 2015-05-03 DIAGNOSIS — Z3A39 39 weeks gestation of pregnancy: Secondary | ICD-10-CM

## 2015-05-03 DIAGNOSIS — O0991 Supervision of high risk pregnancy, unspecified, first trimester: Secondary | ICD-10-CM

## 2015-05-03 LAB — GLUCOSE, CAPILLARY
GLUCOSE-CAPILLARY: 80 mg/dL (ref 65–99)
GLUCOSE-CAPILLARY: 80 mg/dL (ref 65–99)

## 2015-05-03 SURGERY — Surgical Case
Anesthesia: Regional | Site: Abdomen

## 2015-05-03 MED ORDER — FAMOTIDINE IN NACL 20-0.9 MG/50ML-% IV SOLN
INTRAVENOUS | Status: AC
Start: 2015-05-03 — End: 2015-05-03
  Administered 2015-05-03: 20 mg via INTRAVENOUS
  Filled 2015-05-03: qty 50

## 2015-05-03 MED ORDER — DIPHENHYDRAMINE HCL 25 MG PO CAPS
25.0000 mg | ORAL_CAPSULE | ORAL | Status: DC | PRN
  Administered 2015-05-03: 25 mg via ORAL
  Filled 2015-05-03: qty 1

## 2015-05-03 MED ORDER — DEXAMETHASONE SODIUM PHOSPHATE 4 MG/ML IJ SOLN
INTRAMUSCULAR | Status: DC | PRN
  Administered 2015-05-03: 4 mg via INTRAVENOUS

## 2015-05-03 MED ORDER — KETOROLAC TROMETHAMINE 30 MG/ML IJ SOLN: 30.0000 mg | Freq: Four times a day (QID) | INTRAMUSCULAR | Status: AC | PRN

## 2015-05-03 MED ORDER — MEPERIDINE HCL 25 MG/ML IJ SOLN: 6.2500 mg | INTRAMUSCULAR | Status: DC | PRN

## 2015-05-03 MED ORDER — NALBUPHINE HCL 10 MG/ML IJ SOLN
5.0000 mg | INTRAMUSCULAR | Status: DC | PRN
  Administered 2015-05-03: 5 mg via INTRAVENOUS
  Filled 2015-05-03: qty 1

## 2015-05-03 MED ORDER — NALOXONE HCL 0.4 MG/ML IJ SOLN: 0.4000 mg | INTRAMUSCULAR | Status: DC | PRN

## 2015-05-03 MED ORDER — ONDANSETRON HCL 4 MG/2ML IJ SOLN
4.0000 mg | Freq: Once | INTRAMUSCULAR | Status: AC | PRN
Start: 2015-05-03 — End: 2015-05-03
  Administered 2015-05-03: 4 mg via INTRAVENOUS

## 2015-05-03 MED ORDER — LACTATED RINGERS IV SOLN
INTRAVENOUS | Status: DC
  Administered 2015-05-03: 21:00:00 via INTRAVENOUS

## 2015-05-03 MED ORDER — OXYCODONE-ACETAMINOPHEN 5-325 MG PO TABS
2.0000 | ORAL_TABLET | ORAL | Status: DC | PRN
  Administered 2015-05-04 – 2015-05-06 (×4): 2 via ORAL
  Filled 2015-05-03 (×5): qty 2

## 2015-05-03 MED ORDER — TETANUS-DIPHTH-ACELL PERTUSSIS 5-2.5-18.5 LF-MCG/0.5 IM SUSP
0.5000 mL | Freq: Once | INTRAMUSCULAR | Status: DC
Start: 2015-05-04 — End: 2015-05-06

## 2015-05-03 MED ORDER — SODIUM CHLORIDE 0.9 % IJ SOLN: 3.0000 mL | INTRAMUSCULAR | Status: DC | PRN

## 2015-05-03 MED ORDER — NALBUPHINE HCL 10 MG/ML IJ SOLN: 5.0000 mg | Freq: Once | INTRAMUSCULAR | Status: DC | PRN

## 2015-05-03 MED ORDER — CEFAZOLIN SODIUM-DEXTROSE 2-3 GM-% IV SOLR
INTRAVENOUS | Status: AC
  Filled 2015-05-03: qty 50

## 2015-05-03 MED ORDER — ONDANSETRON HCL 4 MG/2ML IJ SOLN
INTRAMUSCULAR | Status: AC
  Filled 2015-05-03: qty 2

## 2015-05-03 MED ORDER — FENTANYL CITRATE (PF) 100 MCG/2ML IJ SOLN: 25.0000 ug | INTRAMUSCULAR | Status: DC | PRN

## 2015-05-03 MED ORDER — MORPHINE SULFATE (PF) 0.5 MG/ML IJ SOLN
INTRAMUSCULAR | Status: AC
  Filled 2015-05-03: qty 10

## 2015-05-03 MED ORDER — OXYTOCIN 10 UNIT/ML IJ SOLN
INTRAMUSCULAR | Status: AC
  Filled 2015-05-03: qty 1

## 2015-05-03 MED ORDER — PHENYLEPHRINE 8 MG IN D5W 100 ML (0.08MG/ML) PREMIX OPTIME
INJECTION | INTRAVENOUS | Status: AC
  Filled 2015-05-03: qty 100

## 2015-05-03 MED ORDER — NALBUPHINE HCL 10 MG/ML IJ SOLN: 5.0000 mg | INTRAMUSCULAR | Status: DC | PRN

## 2015-05-03 MED ORDER — LACTATED RINGERS IV SOLN
Freq: Once | INTRAVENOUS | Status: AC
  Administered 2015-05-03: 12:00:00 via INTRAVENOUS

## 2015-05-03 MED ORDER — DEXAMETHASONE SODIUM PHOSPHATE 4 MG/ML IJ SOLN
INTRAMUSCULAR | Status: AC
  Filled 2015-05-03: qty 1

## 2015-05-03 MED ORDER — SCOPOLAMINE 1 MG/3DAYS TD PT72
MEDICATED_PATCH | TRANSDERMAL | Status: AC
  Administered 2015-05-03: 1.5 mg via TRANSDERMAL
  Filled 2015-05-03: qty 1

## 2015-05-03 MED ORDER — WITCH HAZEL-GLYCERIN EX PADS: 1.0000 "application " | MEDICATED_PAD | CUTANEOUS | Status: DC | PRN

## 2015-05-03 MED ORDER — ONDANSETRON HCL 4 MG/2ML IJ SOLN: 4.0000 mg | Freq: Three times a day (TID) | INTRAMUSCULAR | Status: DC | PRN

## 2015-05-03 MED ORDER — DIPHENHYDRAMINE HCL 50 MG/ML IJ SOLN: 12.5000 mg | INTRAMUSCULAR | Status: DC | PRN

## 2015-05-03 MED ORDER — MENTHOL 3 MG MT LOZG: 1.0000 | LOZENGE | OROMUCOSAL | Status: DC | PRN

## 2015-05-03 MED ORDER — CITRIC ACID-SODIUM CITRATE 334-500 MG/5ML PO SOLN
ORAL | Status: DC | PRN
  Administered 2015-05-03: 30 mL via ORAL

## 2015-05-03 MED ORDER — BUPIVACAINE IN DEXTROSE 0.75-8.25 % IT SOLN
INTRATHECAL | Status: AC
  Filled 2015-05-03: qty 2

## 2015-05-03 MED ORDER — ACETAMINOPHEN 325 MG PO TABS: 650.0000 mg | ORAL_TABLET | ORAL | Status: DC | PRN

## 2015-05-03 MED ORDER — LACTATED RINGERS IV SOLN
INTRAVENOUS | Status: DC | PRN
  Administered 2015-05-03: 13:00:00 via INTRAVENOUS

## 2015-05-03 MED ORDER — FENTANYL CITRATE (PF) 100 MCG/2ML IJ SOLN
INTRAMUSCULAR | Status: DC | PRN
  Administered 2015-05-03: 10 ug via INTRATHECAL

## 2015-05-03 MED ORDER — LACTATED RINGERS IV SOLN
125.0000 mL/h | INTRAVENOUS | Status: DC
  Administered 2015-05-03 (×2): via INTRAVENOUS

## 2015-05-03 MED ORDER — CEFAZOLIN SODIUM-DEXTROSE 2-3 GM-% IV SOLR
2.0000 g | INTRAVENOUS | Status: AC
  Administered 2015-05-03: 2 g via INTRAVENOUS

## 2015-05-03 MED ORDER — KETOROLAC TROMETHAMINE 30 MG/ML IJ SOLN
INTRAMUSCULAR | Status: AC
  Filled 2015-05-03: qty 1

## 2015-05-03 MED ORDER — BUPIVACAINE IN DEXTROSE 0.75-8.25 % IT SOLN
INTRATHECAL | Status: DC | PRN
  Administered 2015-05-03: 1.6 mg via INTRATHECAL

## 2015-05-03 MED ORDER — SIMETHICONE 80 MG PO CHEW
80.0000 mg | CHEWABLE_TABLET | ORAL | Status: DC | PRN
  Administered 2015-05-06: 80 mg via ORAL
  Filled 2015-05-03: qty 1

## 2015-05-03 MED ORDER — OXYTOCIN 10 UNIT/ML IJ SOLN
40.0000 [IU] | INTRAMUSCULAR | Status: DC | PRN
  Administered 2015-05-03: 40 [IU] via INTRAVENOUS

## 2015-05-03 MED ORDER — FENTANYL CITRATE (PF) 100 MCG/2ML IJ SOLN
INTRAMUSCULAR | Status: AC
Start: 2015-05-03 — End: 2015-05-03
  Filled 2015-05-03: qty 2

## 2015-05-03 MED ORDER — NALOXONE HCL 2 MG/2ML IJ SOSY
1.0000 ug/kg/h | PREFILLED_SYRINGE | INTRAVENOUS | Status: DC | PRN
  Filled 2015-05-03: qty 2

## 2015-05-03 MED ORDER — PHENYLEPHRINE 8 MG IN D5W 100 ML (0.08MG/ML) PREMIX OPTIME
INJECTION | INTRAVENOUS | Status: DC | PRN
  Administered 2015-05-03: 60 ug/min via INTRAVENOUS

## 2015-05-03 MED ORDER — MORPHINE SULFATE (PF) 0.5 MG/ML IJ SOLN
INTRAMUSCULAR | Status: DC | PRN
  Administered 2015-05-03: .2 mg via INTRATHECAL

## 2015-05-03 MED ORDER — PRENATAL MULTIVITAMIN CH
1.0000 | ORAL_TABLET | Freq: Every day | ORAL | Status: DC
  Administered 2015-05-04 – 2015-05-05 (×2): 1 via ORAL
  Filled 2015-05-03 (×2): qty 1

## 2015-05-03 MED ORDER — BUPIVACAINE HCL (PF) 0.5 % IJ SOLN
INTRAMUSCULAR | Status: AC
  Filled 2015-05-03: qty 30

## 2015-05-03 MED ORDER — DIPHENHYDRAMINE HCL 25 MG PO CAPS: 25.0000 mg | ORAL_CAPSULE | Freq: Four times a day (QID) | ORAL | Status: DC | PRN

## 2015-05-03 MED ORDER — SCOPOLAMINE 1 MG/3DAYS TD PT72
1.0000 | MEDICATED_PATCH | Freq: Once | TRANSDERMAL | Status: DC
  Administered 2015-05-03: 1.5 mg via TRANSDERMAL

## 2015-05-03 MED ORDER — LACTATED RINGERS IV BOLUS (SEPSIS)
1000.0000 mL | Freq: Once | INTRAVENOUS | Status: AC
  Administered 2015-05-03: 1000 mL via INTRAVENOUS

## 2015-05-03 MED ORDER — ONDANSETRON HCL 4 MG/2ML IJ SOLN
INTRAMUSCULAR | Status: DC | PRN
  Administered 2015-05-03: 4 mg via INTRAVENOUS

## 2015-05-03 MED ORDER — OXYCODONE-ACETAMINOPHEN 5-325 MG PO TABS
1.0000 | ORAL_TABLET | ORAL | Status: DC | PRN
  Administered 2015-05-04 – 2015-05-05 (×2): 1 via ORAL
  Filled 2015-05-03: qty 1

## 2015-05-03 MED ORDER — CITRIC ACID-SODIUM CITRATE 334-500 MG/5ML PO SOLN
ORAL | Status: AC
  Filled 2015-05-03: qty 15

## 2015-05-03 MED ORDER — OXYTOCIN 40 UNITS IN LACTATED RINGERS INFUSION - SIMPLE MED: 62.5000 mL/h | INTRAVENOUS | Status: AC

## 2015-05-03 MED ORDER — METOCLOPRAMIDE HCL 5 MG/ML IJ SOLN
INTRAMUSCULAR | Status: DC | PRN
  Administered 2015-05-03: 10 mg via INTRAVENOUS

## 2015-05-03 MED ORDER — SENNOSIDES-DOCUSATE SODIUM 8.6-50 MG PO TABS
2.0000 | ORAL_TABLET | ORAL | Status: DC
  Administered 2015-05-03 – 2015-05-05 (×3): 2 via ORAL
  Filled 2015-05-03 (×3): qty 2

## 2015-05-03 MED ORDER — VITAMIN K1 1 MG/0.5ML IJ SOLN
INTRAMUSCULAR | Status: AC
  Filled 2015-05-03: qty 0.5

## 2015-05-03 MED ORDER — IBUPROFEN 600 MG PO TABS
600.0000 mg | ORAL_TABLET | Freq: Four times a day (QID) | ORAL | Status: DC
  Administered 2015-05-03 – 2015-05-06 (×10): 600 mg via ORAL
  Filled 2015-05-03 (×10): qty 1

## 2015-05-03 MED ORDER — METOCLOPRAMIDE HCL 5 MG/ML IJ SOLN
INTRAMUSCULAR | Status: AC
  Filled 2015-05-03: qty 2

## 2015-05-03 MED ORDER — LANOLIN HYDROUS EX OINT: 1.0000 "application " | TOPICAL_OINTMENT | CUTANEOUS | Status: DC | PRN

## 2015-05-03 MED ORDER — SIMETHICONE 80 MG PO CHEW
80.0000 mg | CHEWABLE_TABLET | Freq: Three times a day (TID) | ORAL | Status: DC
  Administered 2015-05-03 – 2015-05-06 (×8): 80 mg via ORAL
  Filled 2015-05-03 (×8): qty 1

## 2015-05-03 MED ORDER — DIBUCAINE 1 % RE OINT: 1.0000 "application " | TOPICAL_OINTMENT | RECTAL | Status: DC | PRN

## 2015-05-03 MED ORDER — KETOROLAC TROMETHAMINE 30 MG/ML IJ SOLN
30.0000 mg | Freq: Four times a day (QID) | INTRAMUSCULAR | Status: AC | PRN
  Administered 2015-05-03: 30 mg via INTRAMUSCULAR

## 2015-05-03 MED ORDER — ACETAMINOPHEN 500 MG PO TABS
1000.0000 mg | ORAL_TABLET | Freq: Four times a day (QID) | ORAL | Status: AC
  Administered 2015-05-04 (×2): 1000 mg via ORAL
  Filled 2015-05-03 (×2): qty 2

## 2015-05-03 MED ORDER — SCOPOLAMINE 1 MG/3DAYS TD PT72
1.0000 | MEDICATED_PATCH | Freq: Once | TRANSDERMAL | Status: DC
  Filled 2015-05-03: qty 1

## 2015-05-03 MED ORDER — SIMETHICONE 80 MG PO CHEW
80.0000 mg | CHEWABLE_TABLET | ORAL | Status: DC
  Administered 2015-05-03 – 2015-05-05 (×3): 80 mg via ORAL
  Filled 2015-05-03 (×3): qty 1

## 2015-05-03 MED ORDER — ZOLPIDEM TARTRATE 5 MG PO TABS: 5.0000 mg | ORAL_TABLET | Freq: Every evening | ORAL | Status: DC | PRN

## 2015-05-03 MED ORDER — ERYTHROMYCIN 5 MG/GM OP OINT
TOPICAL_OINTMENT | OPHTHALMIC | Status: AC
  Filled 2015-05-03: qty 1

## 2015-05-03 SURGICAL SUPPLY — 45 items
APL SKNCLS STERI-STRIP NONHPOA (GAUZE/BANDAGES/DRESSINGS) ×1
BENZOIN TINCTURE PRP APPL 2/3 (GAUZE/BANDAGES/DRESSINGS) ×3 IMPLANT
BINDER ABDOMINAL 12 ML 46-62 (SOFTGOODS) ×2 IMPLANT
CATH ROBINSON RED A/P 16FR (CATHETERS) IMPLANT
CLAMP CORD UMBIL (MISCELLANEOUS) IMPLANT
CLOSURE STERI STRIP 1/2 X4 (GAUZE/BANDAGES/DRESSINGS) ×2 IMPLANT
CLOSURE WOUND 1/2 X4 (GAUZE/BANDAGES/DRESSINGS) ×2
CLOTH BEACON ORANGE TIMEOUT ST (SAFETY) ×3 IMPLANT
DRAPE SHEET LG 3/4 BI-LAMINATE (DRAPES) IMPLANT
DRSG OPSITE POSTOP 4X10 (GAUZE/BANDAGES/DRESSINGS) ×3 IMPLANT
DURAPREP 26ML APPLICATOR (WOUND CARE) ×3 IMPLANT
ELECT REM PT RETURN 9FT ADLT (ELECTROSURGICAL) ×3
ELECTRODE REM PT RTRN 9FT ADLT (ELECTROSURGICAL) ×1 IMPLANT
EXTRACTOR VACUUM M CUP 4 TUBE (SUCTIONS) IMPLANT
EXTRACTOR VACUUM M CUP 4' TUBE (SUCTIONS)
GLOVE BIOGEL PI IND STRL 7.0 (GLOVE) ×1 IMPLANT
GLOVE BIOGEL PI IND STRL 7.5 (GLOVE) ×2 IMPLANT
GLOVE BIOGEL PI INDICATOR 7.0 (GLOVE) ×2
GLOVE BIOGEL PI INDICATOR 7.5 (GLOVE) ×4
GLOVE ECLIPSE 7.5 STRL STRAW (GLOVE) ×3 IMPLANT
GOWN STRL REUS W/TWL LRG LVL3 (GOWN DISPOSABLE) ×9 IMPLANT
KIT ABG SYR 3ML LUER SLIP (SYRINGE) IMPLANT
NDL HYPO 25X5/8 SAFETYGLIDE (NEEDLE) IMPLANT
NEEDLE HYPO 25X5/8 SAFETYGLIDE (NEEDLE) IMPLANT
NS IRRIG 1000ML POUR BTL (IV SOLUTION) ×3 IMPLANT
PACK C SECTION WH (CUSTOM PROCEDURE TRAY) ×3 IMPLANT
PAD ABD 7.5X8 STRL (GAUZE/BANDAGES/DRESSINGS) ×2 IMPLANT
PAD ABD 8X7 1/2 STERILE (GAUZE/BANDAGES/DRESSINGS) ×2 IMPLANT
PAD OB MATERNITY 4.3X12.25 (PERSONAL CARE ITEMS) ×3 IMPLANT
PENCIL SMOKE EVAC W/HOLSTER (ELECTROSURGICAL) ×3 IMPLANT
RTRCTR C-SECT PINK 25CM LRG (MISCELLANEOUS) ×3 IMPLANT
SPONGE GAUZE 4X4 12PLY STER LF (GAUZE/BANDAGES/DRESSINGS) ×4 IMPLANT
STRIP CLOSURE SKIN 1/2X4 (GAUZE/BANDAGES/DRESSINGS) ×3 IMPLANT
SUT MNCRL 0 VIOLET CTX 36 (SUTURE) IMPLANT
SUT MONOCRYL 0 CTX 36 (SUTURE)
SUT PDS AB 0 CTX 60 (SUTURE) ×2 IMPLANT
SUT PLAIN 2 0 XLH (SUTURE) ×2 IMPLANT
SUT VIC AB 0 CTX 36 (SUTURE) ×9
SUT VIC AB 0 CTX36XBRD ANBCTRL (SUTURE) ×3 IMPLANT
SUT VIC AB 2-0 CT1 27 (SUTURE) ×3
SUT VIC AB 2-0 CT1 TAPERPNT 27 (SUTURE) ×1 IMPLANT
SUT VIC AB 2-0 CTX 36 (SUTURE) ×2 IMPLANT
SUT VIC AB 4-0 KS 27 (SUTURE) ×3 IMPLANT
TOWEL OR 17X24 6PK STRL BLUE (TOWEL DISPOSABLE) ×3 IMPLANT
TRAY FOLEY CATH SILVER 14FR (SET/KITS/TRAYS/PACK) ×3 IMPLANT

## 2015-05-03 NOTE — Progress Notes (Signed)
Pt vomiting all PO intake. Output 70ml. Bolus LR

## 2015-05-03 NOTE — Anesthesia Procedure Notes (Addendum)
Epidural Patient location during procedure: OB  Staffing Anesthesiologist: Myana Schlup Performed by: anesthesiologist   Preanesthetic Checklist Completed: patient identified, site marked, surgical consent, pre-op evaluation, timeout performed, IV checked, risks and benefits discussed and monitors and equipment checked  Epidural Patient position: sitting Prep: site prepped and draped and DuraPrep Patient monitoring: continuous pulse ox and blood pressure Approach: midline Location: L3-L4 Injection technique: LOR saline  Needle:  Needle type: Tuohy  Needle gauge: 17 G Needle length: 9 cm and 9 Needle insertion depth: 7 cm Catheter type: closed end flexible Catheter size: 19 Gauge Catheter at skin depth: 12 cm Test dose: negative  Assessment Events: blood not aspirated, injection not painful, no injection resistance, negative IV test and no paresthesia  Additional Notes Patient identified. Risks/Benefits/Options discussed with patient including but not limited to bleeding, infection, nerve damage, paralysis, failed block, incomplete pain control, headache, blood pressure changes, nausea, vomiting, reactions to medication both or allergic, itching and postpartum back pain. Confirmed with bedside nurse the patient's most recent platelet count. Confirmed with patient that they are not currently taking any anticoagulation, have any bleeding history or any family history of bleeding disorders. Patient expressed understanding and wished to proceed. All questions were answered. Sterile technique was used throughout the entire procedure. Please see nursing notes for vital signs. Test dose was given through epidural catheter and negative prior to continuing to dose epidural or start infusion. Warning signs of high block given to the patient including shortness of breath, tingling/numbness in hands, complete motor block, or any concerning symptoms with instructions to call for help. Patient was  given instructions on fall risk and not to get out of bed. All questions and concerns addressed with instructions to call with any issues or inadequate analgesia.    This was a CSE. 27ga Sprotte advanced through tuohy after LOR and clear csf return. Spinal dose given and sprotte withdrawn. Epidural catheter thread easily.

## 2015-05-03 NOTE — Lactation Note (Signed)
This note was copied from the chart of Ruth Azzie AlmasKaylee Schwer. Lactation Consultation Note  Initial visit made.  Breastfeeding consultation services and support information given to patient.  Mom breastfed her previous baby but it has been 7 years.  Baby is currently latched on well and sucking actively.  Instructed on alternate breast massage and feeding on cue.  Encouraged to call with concerns or assist.  Patient Name: Ruth Duran YNWGN'FToday's Date: Duran Reason for consult: Initial assessment   Maternal Data    Feeding Feeding Type: Breast Fed Length of feed: 20 min  LATCH Score/Interventions Latch: Repeated attempts needed to sustain latch, nipple held in mouth throughout feeding, stimulation needed to elicit sucking reflex. Intervention(s): Assist with latch;Breast massage  Audible Swallowing: A few with stimulation Intervention(s): Skin to skin  Type of Nipple: Everted at rest and after stimulation  Comfort (Breast/Nipple): Soft / non-tender     Hold (Positioning): Full assist, staff holds infant at breast Intervention(s): Support Pillows;Position options  LATCH Score: 6  Lactation Tools Discussed/Used     Consult Status Consult Status: Follow-up Date: 05/04/15 Follow-up type: In-patient    Huston FoleyMOULDEN, Ruth Duran, 6:18 PM

## 2015-05-03 NOTE — Transfer of Care (Signed)
Immediate Anesthesia Transfer of Care Note  Patient: Ruth Duran  Procedure(s) Performed: Procedure(s): CESAREAN SECTION (N/A)  Patient Location: PACU  Anesthesia Type:Spinal  Level of Consciousness: awake  Airway & Oxygen Therapy: Patient Spontanous Breathing  Post-op Assessment: Report given to RN and Post -op Vital signs reviewed and stable  Post vital signs: stable  Last Vitals:  Filed Vitals:   05/03/15 1111  BP: 132/82  Pulse: 98  Temp: 36.8 C  Resp: 18    Complications: No apparent anesthesia complications

## 2015-05-03 NOTE — Op Note (Signed)
Cesarean Section Operative Report   Indications: history 3 prior cesarean sections  Pre-operative Diagnosis: history cesarean section x3, obesity, history abruption, history PPROM, A2GDM, breech presentation  Post-operative Diagnosis: Same   Surgeon:  Nicholaus BloomMyra Dove, MD - primary Shonna ChockNoah Wouk, MD - secondary  Attending Attestation: I was present and scrubbed for the entire procedure.   Assistants: none  Anesthesia: spinal   Estimated Blood Loss: 600 ml  Total IV Fluids: 2400 ml LR  Urine Output:: 75 ml clear yellow urine  Specimens: none  Findings: Viable female infant in breech presentation; Apgars 8/9; weight 4020 g; arterial cord pH not obtained; clear amniotic fluid; intact placenta with three vessel cord; normal uterus, fallopian tubes and ovaries bilaterally. Moderate subcutaneous edema. No significant adhesive disease encountered. Vertical skin incision (decision made prior to surgery given history c/s x3). Left inferior extension of hysterotomy incision to lower margin of lower uterine segment.  Baby condition / location: Couplet care / Skin to Skin   Complications: no complications  Indications: Ruth Duran is a 29 y.o. [id number] with an IUP 802w2d presenting for scheduled repeat cesarean section. A2GDM, random glucose 80 on admission.  The risks, benefits, complications, treatment options, and expected outcomes were discussed with the patient . The patient concurred with the proposed plan, giving informed consent. identified as Ruth Duran and the procedure verified as C-Section Delivery.  Procedure Details:  The patient was taken back to the operative suite where spinal anesthesia was placed.  A time out was held and the above information confirmed.   After induction of anesthesia, the patient was draped and prepped in the usual sterile manner and placed in a dorsal supine position with a leftward tilt. A vertical incision was made and sharply carried down  through the subcutaneous tissue to the fascia. A moderate amount subcutaneous edema was noted. Fascial incision was made and sharply extended su periorally and inferiorally. The peritoneum was identified and bluntly entered and extended longitudinally. Bladder retractor was placed. No significant adhesive disease was noted. A low transverse uterine incision was made and extended bluntly. The placenta was transected during this process. Delivered from breech presentation was a viable infant with Apgars and weight as above. The umbilical cord was clamped and cut cord blood was obtained for evaluation. Cord ph was not sent. The placenta was removed Intact and appeared normal. The uterine outline, tubes and ovaries appeared normal}. An extension of the hysterotomy incision was noted on the left side that extended to the lower portion of the lower uterine segment. The uterine incision was closed with running locked sutures of 2-0Vicryl without an imbricating layer. Hemostasis was observed after placement of three figure-of-eight sutures for bleeding at the site of the hysterotomy incision. The peritoneum was closed with 2-0 vicryl. The fascia was then reapproximated with running sutures of 0-PDS. The subcuticular closure was performed using 2-0plain gut. The skin was closed with 2-0Vicryl with steri-strips. Pressure dressing and abdominal binder will be placed.  Instrument, sponge, and needle counts were correct prior the abdominal closure and were correct at the conclusion of the case.     Disposition: PACU - hemodynamically stable.   Maternal Condition: stable       Signed: Cherrie Gauzeoah B WoukMD 05/03/2015 1:33 PM

## 2015-05-03 NOTE — Lactation Note (Signed)
This note was copied from the chart of Ruth Azzie AlmasKaylee [name]. Lactation Consultation Note Follow up visit at 7 hours of age.  Mom is very sleepy and visitors including support person have left.  Mom is holding baby STS with latching on and off baby is fussy. LC changed a small void and assisted with STS football hold on right breast.  Mom is able to express drops of colostrum.  Baby latched for about 5 minutes and then was very sleepy. LC placed baby back in crib swaddled per moms request.    Patient Name: Ruth Azzie AlmasKaylee Welty UJWJX'BToday's Date: 05/03/2015 Reason for consult: Follow-up assessment   Maternal Data    Feeding Feeding Type: Breast Fed Length of feed: 5 min  LATCH Score/Interventions Latch: Grasps breast easily, tongue down, lips flanged, rhythmical sucking. Intervention(s): Adjust position;Assist with latch;Breast massage;Breast compression  Audible Swallowing: A few with stimulation Intervention(s): Skin to skin  Type of Nipple: Everted at rest and after stimulation  Comfort (Breast/Nipple): Soft / non-tender     Hold (Positioning): Assistance needed to correctly position infant at breast and maintain latch. Intervention(s): Breastfeeding basics reviewed;Support Pillows;Position options;Skin to skin  LATCH Score: 8  Lactation Tools Discussed/Used     Consult Status Consult Status: Follow-up Date: 05/04/15 Follow-up type: In-patient    Jannifer RodneyShoptaw, Ruth Lynn 05/03/2015, 8:45 PM

## 2015-05-03 NOTE — Anesthesia Preprocedure Evaluation (Addendum)
Anesthesia Evaluation  Patient identified by MRN, date of birth, ID band Patient awake    Reviewed: Allergy & Precautions, NPO status , Patient's Chart, lab work & pertinent test results  History of Anesthesia Complications Negative for: history of anesthetic complications  Airway Mallampati: II  TM Distance: >3 FB Neck ROM: Full    Dental no notable dental hx. (+) Dental Advisory Given   Pulmonary neg pulmonary ROS, former smoker,    Pulmonary exam normal breath sounds clear to auscultation       Cardiovascular negative cardio ROS Normal cardiovascular exam Rhythm:Regular Rate:Normal     Neuro/Psych negative neurological ROS  negative psych ROS   GI/Hepatic negative GI ROS, Neg liver ROS,   Endo/Other  diabetes, GestationalMorbid obesity  Renal/GU negative Renal ROS  negative genitourinary   Musculoskeletal negative musculoskeletal ROS (+)   Abdominal   Peds negative pediatric ROS (+)  Hematology negative hematology ROS (+)   Anesthesia Other Findings   Reproductive/Obstetrics (+) Pregnancy                            Anesthesia Physical Anesthesia Plan  ASA: III  Anesthesia Plan: Combined Spinal and Epidural   Post-op Pain Management:    Induction:   Airway Management Planned:   Additional Equipment:   Intra-op Plan:   Post-operative Plan:   Informed Consent: I have reviewed the patients History and Physical, chart, labs and discussed the procedure including the risks, benefits and alternatives for the proposed anesthesia with the patient or authorized representative who has indicated his/her understanding and acceptance.   Dental advisory given  Plan Discussed with: CRNA  Anesthesia Plan Comments:        Anesthesia Quick Evaluation

## 2015-05-03 NOTE — H&P (Signed)
LABOR AND DELIVERY ADMISSION HISTORY AND PHYSICAL NOTE  Ruth Duran is a 29 y.o. female [phone] with IUP at [redacted]w[redacted]d by 7 wk u/s presenting for scheduled repeat c/s. History c/s x3.   She reports positive fetal movement. She denies leakage of fluid or vaginal bleeding.  Prenatal History/Complications:  Past Medical History: Past Medical History  Diagnosis Date  . Pancreatitis   . Preterm labor   . Gestational diabetes mellitus, antepartum   . Gestational diabetes   . Kidney stone     Past Surgical History: Past Surgical History  Procedure Laterality Date  . Cesarean section      x2    Obstetrical History: OB History    Gravida Para Term Preterm AB TAB SAB Ectopic Multiple Living   Social History: Social History   Social History  . Marital Status: Married    Spouse Name: N/A  . Number of Children: N/A  . Years of Education: N/A   Occupational History  . homemaker    Social History Main Topics  . Smoking status: Former Smoker -- 0.20 packs/day for 4 years    Types: Cigarettes  . Smokeless tobacco: Never Used     Comment: April 2016  . Alcohol Use: No  . Drug Use: No  . Sexual Activity:    Partners: Male   Other Topics Concern  . Not on file   Social History Narrative    Family History: Family History  Problem Relation Age of Onset  . Diabetes Maternal Grandfather   . Heart attack Father     Allergies: No Known Allergies  No prescriptions prior to admission     Review of Systems   All systems reviewed and negative except as stated in HPI  Last menstrual period 07/28/2014, unknown if currently breastfeeding. General appearance: alert, cooperative and appears stated age Lungs: clear to auscultation bilaterally Heart: regular rate and rhythm Abdomen: soft, non-tender; bowel sounds normal Extremities: No calf swelling or tenderness Presentation: breech   Prenatal labs: ABO, Rh: --/--/B POS (12/09 1015) Antibody: NEG  (12/09 1015) Rubella: !Error!immune RPR: Non Reactive (12/09 1015)  HBsAg: NEGATIVE (05/02 1451)  HIV: NONREACTIVE (09/19 0907)  GBS:   neg 1 hr Glucola: 201 Genetic screening:  declined Anatomy US: wnl  Prenatal Transfer Tool  Maternal Diabetes: Yes:  Diabetes Type:  Insulin/Medication controlled Genetic Screening: Declined Maternal Ultrasounds/Referrals: Normal Fetal Ultrasounds or other Referrals:  None Maternal Substance Abuse:  No Significant Maternal Medications:  None Significant Maternal Lab Results: Lab values include: Group B Strep negative  No results found for this or any previous visit (from the past 24 hour(s)).  Patient Active Problem List   Diagnosis Date Noted  . AFI (amniotic fluid index) borderline low 03/03/2015  . Gestational diabetes mellitus in third trimester 02/09/2015  . H/O: C-section 12/25/2014  . Cervical insufficiency during pregnancy in second trimester, antepartum   . History of preterm premature rupture of membranes (PROM) in previous pregnancy, currently pregnant   . History of preterm premature rupture of membranes (PROM) in previous pregnancy, currently pregnant in first trimester 09/21/2014  . History of cesarean section complicating pregnancy 09/21/2014  . Supervision of high risk pregnancy in first trimester 09/21/2014  . History of placenta abruption 05/08/2013  . PANCREATITIS, HX OF 03/18/2009    Assessment: Ruth Duran is a 29 y.o. [id number] at [redacted]w[redacted]d here for scheduled repeat cesarean section. Also with a2gdm.  #  Labor: c/s today. 2 g ancef. H stable at 10.8, platelets 211. Anterior placenta. Plan for vertical skin incision given hx c/s x3 #ID:  gbs negative #MOF: breast #MOC: BTL if adhesive disease encountered, will get final consent from patient intra-op. This is her her wishes. #Circ:  N/a #A2GDM: on glyburide 2.5 qe and 5 qhs. Random glucose on admission 80. Plan for POD#2 fasting glucose and 6-12 wk PP 2-hour GTT.  Cherrie Gauzeoah B  Wouk 05/03/2015, 10:54 AM

## 2015-05-04 ENCOUNTER — Encounter (HOSPITAL_COMMUNITY): Payer: Self-pay | Admitting: Family Medicine

## 2015-05-04 LAB — GLUCOSE, CAPILLARY
Glucose-Capillary: 158 mg/dL — ABNORMAL HIGH (ref 65–99)
Glucose-Capillary: 121 mg/dL — ABNORMAL HIGH (ref 65–99)
Glucose-Capillary: 128 mg/dL — ABNORMAL HIGH (ref 65–99)

## 2015-05-04 LAB — CBC
HCT: 26.5 % — ABNORMAL LOW (ref 36.0–46.0)
Hemoglobin: 8.9 g/dL — ABNORMAL LOW (ref 12.0–15.0)
MCH: 28.9 pg (ref 26.0–34.0)
MCHC: 33.6 g/dL (ref 30.0–36.0)
MCV: 86 fL (ref 78.0–100.0)
PLATELETS: 222 10*3/uL (ref 150–400)
RBC: 3.08 MIL/uL — AB (ref 3.87–5.11)
RDW: 14.3 % (ref 11.5–15.5)
WBC: 12.5 10*3/uL — AB (ref 4.0–10.5)

## 2015-05-04 LAB — BIRTH TISSUE RECOVERY COLLECTION (PLACENTA DONATION)

## 2015-05-04 NOTE — Progress Notes (Signed)
Subjective: Postpartum Day 1: Cesarean Delivery Patient reports tolerating PO, + flatus and no problems voiding.    Objective: Vital signs in last 24 hours: Temp:  [97.4 F (36.3 C)-98.7 F (37.1 C)] 98 F (36.7 C) (12/13 0402) Pulse Rate:  [52-98] 64 (12/13 0402) Resp:  [10-27] 18 (12/13 0402) BP: (101-132)/(49-86) 101/54 mmHg (12/13 0402) SpO2:  [92 %-100 %] 95 % (12/13 0402)  Physical Exam:  General: alert, cooperative and no distress Lochia: appropriate Uterine Fundus: firm Incision: no significant drainage DVT Evaluation: No evidence of DVT seen on physical exam. Negative Homan's sign. No cords or calf tenderness. No significant calf/ankle edema.   Recent Labs  05/04/15 0515  HGB 8.9*  HCT 26.5*    Assessment/Plan: Status post Cesarean section. Doing well postoperatively.  Continue current care. Discussed contraceptive choices with pt, with LARCs presented as most effective.  Questions answered. Pt undecided at this time.  LEFTWICH-KIRBY, Frantz Quattrone 05/04/2015, 6:38 AM

## 2015-05-04 NOTE — Lactation Note (Signed)
This note was copied from the chart of [name]. Lactation Consultation Note Follow up visit at 33 hours of age.  Mom reports baby has been sleepy and she isn't sure baby can open her mouth big enough to feed well.  Baby is drowsy in moms arms.  Assisted with waking techniques and baby showed some feeding cues.  Assisted with latch in cross cradle hold, mom was attempting cradle hold and didn't have good control over latching baby.  Baby can open mouth wide with deep latch and flanged lips.  Stimulation needed to maintain feeding.  Mom reports baby fed for about 15 min on each breast about 2 hours ago.  Encouraged mom to feed with early cues, encouraged mom to also work on hand expression prior to latch.  Instructed mom on hand expression and spoon feeding as a supplement if needed.  Mom to call for latch assist from RN if she is having difficulty or pain with latch.    Patient Name: [name] WUJWJ'XToday's Date: 05/04/2015 Reason for consult: Follow-up assessment   Maternal Data    Feeding Feeding Type: Breast Fed Length of feed:  (observed 5 good minutes)  LATCH Score/Interventions Latch: Repeated attempts needed to sustain latch, nipple held in mouth throughout feeding, stimulation needed to elicit sucking reflex. Intervention(s): Adjust position;Assist with latch;Breast massage;Breast compression  Audible Swallowing: A few with stimulation Intervention(s): Skin to skin;Hand expression;Alternate breast massage  Type of Nipple: Everted at rest and after stimulation  Comfort (Breast/Nipple): Soft / non-tender     Hold (Positioning): Assistance needed to correctly position infant at breast and maintain latch. Intervention(s): Breastfeeding basics reviewed;Support Pillows;Position options;Skin to skin  LATCH Score: 7  Lactation Tools Discussed/Used     Consult Status Consult Status: Follow-up Date: 05/05/15 Follow-up type: In-patient    Jannifer RodneyShoptaw, Charlesia Canaday  Lynn 05/04/2015, 10:22 PM

## 2015-05-04 NOTE — Anesthesia Postprocedure Evaluation (Signed)
Anesthesia Post Note  Patient: Ruth Duran  Procedure(s) Performed: Procedure(s) (LRB): CESAREAN SECTION (N/A)  Patient location during evaluation: Mother Baby Anesthesia Type: Spinal Level of consciousness: awake Pain management: satisfactory to patient Vital Signs Assessment: post-procedure vital signs reviewed and stable Respiratory status: spontaneous breathing Cardiovascular status: stable Anesthetic complications: no    Last Vitals:  Filed Vitals:   05/03/15 2356 05/04/15 0402  BP: 122/49 101/54  Pulse: 65 64  Temp: 37 C 36.7 C  Resp: 18 18    Last Pain:  Filed Vitals:   05/04/15 0611  PainSc: 0-No pain                 Barbar Brede

## 2015-05-04 NOTE — Progress Notes (Signed)
Patient forgot to call me when her lunch arrived to check cbg. She has eaten her meal.

## 2015-05-05 LAB — GLUCOSE, CAPILLARY: Glucose-Capillary: 79 mg/dL (ref 65–99)

## 2015-05-05 MED ORDER — CALCIUM CARBONATE ANTACID 500 MG PO CHEW
200.0000 mg | CHEWABLE_TABLET | Freq: Three times a day (TID) | ORAL | Status: DC
  Administered 2015-05-05 – 2015-05-06 (×2): 200 mg via ORAL
  Filled 2015-05-05 (×2): qty 1

## 2015-05-05 NOTE — Progress Notes (Addendum)
Post Partum Day 2  Subjective:  Ruth Duran is a 29 y.o. [id number] 5840w0d s/p LTCS.  No acute events overnight.  Pt denies problems with ambulating, voiding or po intake.  She denies nausea or vomiting.  Pain is well controlled.  She has had flatus. She has not had bowel movement.  Lochia Minimal.  Plan for birth control is undecided .  Method of Feeding: Breast  Objective: BP 121/66 mmHg  Pulse 79  Temp(Src) 97.9 F (36.6 C) (Oral)  Resp 20  SpO2 98%  LMP 07/28/2014  Breastfeeding? Unknown  Physical Exam:  General: alert, cooperative and no distress Lochia:normal flow Chest: CTAB Heart: RRR no m/r/g Abdomen: +BS, soft, nontender, fundus firm at/below umbilicus Uterine Fundus: firm,  DVT Evaluation: No evidence of DVT seen on physical exam.   Recent Labs  05/04/15 0515  HGB 8.9*  HCT 26.5*     Assessment/Plan:  ASSESSMENT: Ruth Duran is a 29 y.o. [id number] 7140w0d ppd #2 s/p rLTCS doing well.   Plan for discharge tomorrow   LOS: 2 days   Luberta Grabinski Z Bodee Lafoe 05/05/2015, 6:05 AM

## 2015-05-05 NOTE — Progress Notes (Signed)
Vinyl pressure dressing removed. The honeycomb dressing and gauze dressing underneath was saturated with serosanguinous drainage and discontinued. The vertical skin incision is superficiously nonapproximated along several small areas along its length. Upon removal of the saturated honeycomb dressing, half of the adhesive strips came off with it, and these strips were replaced with new ones. A new honeycomb dressing was applied.

## 2015-05-05 NOTE — Lactation Note (Signed)
This note was copied from the chart of Ruth Duran. Lactation Consultation Note  Patient Name: Ruth Azzie AlmasKaylee Riffel WUJWJ'XToday's Date: 05/05/2015 Reason for consult: Follow-up assessment Baby at 55 hr of life and mom reports bf is going well. She has been working with baby to get a deeper latch. Denies breast or nipple pain. Discussed breast changes, nipple care, feeding frequency, and pumping. Mom is aware of OP services and support group.   Maternal Data    Feeding Length of feed: 80 min  LATCH Score/Interventions                      Lactation Tools Discussed/Used     Consult Status Consult Status: Follow-up Date: 05/06/15 Follow-up type: In-patient    Rulon Eisenmengerlizabeth E Topanga Alvelo 05/05/2015, 8:26 PM

## 2015-05-06 MED ORDER — FERROUS SULFATE 325 (65 FE) MG PO TABS: 325.0000 mg | ORAL_TABLET | Freq: Every day | ORAL | Status: DC

## 2015-05-06 MED ORDER — OXYCODONE-ACETAMINOPHEN 5-325 MG PO TABS: 1.0000 | ORAL_TABLET | Freq: Four times a day (QID) | ORAL | Status: AC | PRN

## 2015-05-06 NOTE — Lactation Note (Signed)
This note was copied from the chart of Ruth Azzie [name]. Lactation Consultation Note  P4, Mother denies problems or soreness.  Her breasts are filling. Reviewed engorgement care and monitoring voids/stools. Suggest she call if she needs further instruction.   Patient Name: Ruth Duran ZOXWR'UToday's Date: 05/06/2015 Reason for consult: Follow-up assessment   Maternal Data    Feeding Feeding Type: Breast Fed  LATCH Score/Interventions                      Lactation Tools Discussed/Used     Consult Status Consult Status: Complete    Hardie PulleyBerkelhammer, Hanley Woerner Boschen 05/06/2015, 8:49 AM

## 2015-05-06 NOTE — Discharge Summary (Signed)
OB Discharge Summary  Patient Name: Ruth AlmasKaylee Duran DOB: [birth year] MRN: [id number]  Date of admission: 05/03/2015 Delivering MD: Nicholaus BloomVE, MYRA C   Date of discharge: 05/06/2015  Admitting diagnosis: cpt [id number] - REPEAT c-s Intrauterine pregnancy: 6475w0d     Secondary diagnosis:Principal Problem:   Status post repeat low transverse cesarean section Active Problems:   History of placenta abruption   Gestational diabetes mellitus in third trimester   Term pregnancy  Additional problems:None     Discharge diagnosis: Term Pregnancy Delivered and GDM A2                                                                     Post partum procedures: None  Augmentation: N/A  Complications: None  Hospital course:  Sceduled C/S   29 y.o. yo [id number] at 2775w0d was admitted to the hospital 05/03/2015 for scheduled cesarean section with the following indication:Elective Repeat.  Membrane Rupture Time/Date: 12:49 PM ,05/03/2015   Patient delivered a Viable infant.05/03/2015  Details of operation can be found in separate operative note.  Pateint had an uncomplicated postpartum course.  She is ambulating, tolerating a regular diet, passing flatus, and urinating well. Patient is discharged home in stable condition on 05/06/15.        Physical exam  Filed Vitals:   05/04/15 1123 05/04/15 1840 05/05/15 0701 05/06/15 0608  BP: 115/57 121/66 111/52 135/75  Pulse: 70 79 60 85  Temp: 97.8 F (36.6 C) 97.9 F (36.6 C) 98.6 F (37 C)   TempSrc: Oral Oral Oral   Resp: 20 20 18 17   SpO2: 98%      General: alert, cooperative and no distress Lochia: appropriate Uterine Fundus: firm Incision: Healing well with no significant drainage, Dressing is clean, dry, and intact DVT Evaluation: No evidence of DVT seen on physical exam. 2+ pitting edema to bilateral feet.  Labs: Lab Results  Component Value Date   WBC 12.5* 05/04/2015   HGB 8.9* 05/04/2015   HCT 26.5* 05/04/2015   MCV 86.0 05/04/2015    PLT 222 05/04/2015   CMP Latest Ref Rng 04/30/2015  Glucose 65 - 99 mg/dL 578(I102(H)  BUN 6 - 20 mg/dL 7  Creatinine 6.960.44 - 2.951.00 mg/dL 2.840.66  Sodium 132135 - 440145 mmol/L 137  Potassium 3.5 - 5.1 mmol/L 3.4(L)  Chloride 101 - 111 mmol/L 105  CO2 22 - 32 mmol/L 24  Calcium 8.9 - 10.3 mg/dL 8.9    Discharge instruction: per After Visit Summary and "Baby and Me Booklet".  After Visit Meds:    Medication List    STOP taking these medications        calcium carbonate 500 MG chewable tablet  Commonly known as:  TUMS - dosed in mg elemental calcium     glucose blood test strip     glyBURIDE 2.5 MG tablet  Commonly known as:  DIABETA     onetouch ultrasoft lancets     prenatal multivitamin Tabs tablet      TAKE these medications        ferrous sulfate 325 (65 FE) MG tablet  Take 1 tablet (325 mg total) by mouth daily with breakfast.     oxyCODONE-acetaminophen 5-325 MG tablet  Commonly known as:  PERCOCET/ROXICET  Take 1 tablet by mouth every 6 (six) hours as needed (for pain scale 4-7).        Diet: routine diet  Activity: Advance as tolerated. Pelvic rest for 6 weeks.   Outpatient follow up:2 weeks Follow up Appt:No future appointments. Follow up visit: No Follow-up on file.  Postpartum contraception: Undecided (but is considering Nexplanon v. IUD)  Newborn Data: Live born female  Birth Weight: 8 lb 13.8 oz (4020 g) APGAR: 8, 9  Baby Feeding: Breast Disposition:home with mother   05/06/2015 Ruth Canter, MD  CNM attestation I have seen and examined this patient and agree with above documentation in the resident's note.   Ruth Duran is a 29 y.o. [id number] s/p rLTCS.   Pain is well controlled.  Plan for birth control is LARC.  Method of Feeding: breast  PE:  BP 135/75 mmHg  Pulse 85  Temp(Src) 98.6 F (37 C) (Oral)  Resp 17  SpO2 98%  LMP 07/28/2014  Breastfeeding? Unknown Fundus firm  No results for input(s): HGB, HCT in the last 72  hours.   Plan: discharge today - postpartum care discussed - f/u clinic in 6 weeks for postpartum visit   SHAW, KIMBERLY, CNM 12:30 AM

## 2015-05-26 ENCOUNTER — Encounter: Payer: Self-pay | Admitting: Obstetrics & Gynecology

## 2015-05-26 ENCOUNTER — Ambulatory Visit (INDEPENDENT_AMBULATORY_CARE_PROVIDER_SITE_OTHER): Payer: Self-pay | Admitting: Obstetrics & Gynecology

## 2015-05-26 VITALS — BP 124/77 | HR 68 | Temp 98.3°F | Resp 16 | Ht 62.0 in | Wt 220.0 lb

## 2015-05-26 DIAGNOSIS — Z98891 History of uterine scar from previous surgery: Secondary | ICD-10-CM

## 2015-05-26 DIAGNOSIS — L03311 Cellulitis of abdominal wall: Secondary | ICD-10-CM

## 2015-05-26 DIAGNOSIS — Z9889 Other specified postprocedural states: Secondary | ICD-10-CM

## 2015-05-26 DIAGNOSIS — Z8632 Personal history of gestational diabetes: Secondary | ICD-10-CM

## 2015-05-26 MED ORDER — CEPHALEXIN 500 MG PO CAPS: 500.0000 mg | ORAL_CAPSULE | Freq: Three times a day (TID) | ORAL | Status: AC

## 2015-05-27 DIAGNOSIS — Z8632 Personal history of gestational diabetes: Secondary | ICD-10-CM | POA: Insufficient documentation

## 2015-05-27 NOTE — Progress Notes (Signed)
   Subjective:    Patient ID: Ruth Duran, female    DOB: [birth year], 30 y.o.   MRN: [id number]  HPI  Pt presents for incision check.  Pt had 3 days of erythema of her vertical skin incision.  She has placed some lavender and other essential oils on it which improved it.  Minimal amount of drainage.  No pain.   Pt also had questions about why vertical incision was done and why Dr. Macon LargeAnyanwu didn't do her c/s  (she had requested her). I explained that sometime vertical incisions are done if abundant scar tissue is expected.  (it was her 4th c/s).  I also explained that our schedule is made many months in advance and Dr. Macon LargeAnyanwu must not have been schedueld for labor c/s coverage that day. Pt would like to talk to Dr. Marice Potterove to get more details about how her uterus looked.  Review of Systems  Constitutional: Negative.   Respiratory: Negative.   Cardiovascular: Negative.   Gastrointestinal: Negative.   Genitourinary: Negative.   Skin:       Erythema around the skin incision.       Objective:   Physical Exam  Constitutional: She appears well-developed and well-nourished. No distress.  HENT:  Head: Normocephalic and atraumatic.  Eyes: Conjunctivae are normal.  Cardiovascular: Normal rate.   Pulmonary/Chest: Effort normal.  Abdominal: Soft. She exhibits no distension and no mass. There is no tenderness. There is no rebound and no guarding.    Skin: Skin is warm and dry.          Assessment & Plan:  30 yo female s/p 4th c/s with mild cellulitis  1-Keflex tid 2-neosporin and keep covred for next 3 days 3-RTC in 1 week.  Will see Dr. Marice Potterove so she can answer her questions about the state of her uterus.

## 2015-06-03 ENCOUNTER — Ambulatory Visit (INDEPENDENT_AMBULATORY_CARE_PROVIDER_SITE_OTHER): Payer: Self-pay | Admitting: Obstetrics & Gynecology

## 2015-06-03 ENCOUNTER — Encounter: Payer: Self-pay | Admitting: Obstetrics & Gynecology

## 2015-06-03 VITALS — BP 106/67 | HR 66 | Resp 16 | Ht 62.0 in | Wt 221.0 lb

## 2015-06-03 DIAGNOSIS — Z4889 Encounter for other specified surgical aftercare: Secondary | ICD-10-CM

## 2015-06-03 NOTE — Progress Notes (Signed)
   Subjective:    Patient ID: Ruth Duran, female    DOB: [birth year], 30 y.o.   MRN: [id number]  HPI 30 yo MW lady here 4 weeks after her 4th C/S for a wound check. She had sex yesterday with no form of contraception except for breastfeeding. She doesn't want another child for "years".   Review of Systems     Objective:   Physical Exam [id number] Ambulating, conversing, and breathing normally Incision healing well Abd- obese, benign       Assessment & Plan:  GMD- RTC 2 weeks for pp visit and 2 hour GTT Contraception- no sex until pp visit. If UPT is negative then, she can have IUD or POPs

## 2015-06-07 ENCOUNTER — Encounter: Payer: Self-pay | Admitting: *Deleted

## 2015-06-14 ENCOUNTER — Ambulatory Visit: Payer: Self-pay | Admitting: Advanced Practice Midwife

## 2017-01-23 IMAGING — US US MFM OB FOLLOW-UP
1 series · 12 of 28 positions shown · non-contrast
Comparison: none

[Series 1: us mfm ob follow-up · 0.30mm/px · 12 of 49 slices shown]
[im 2/49]
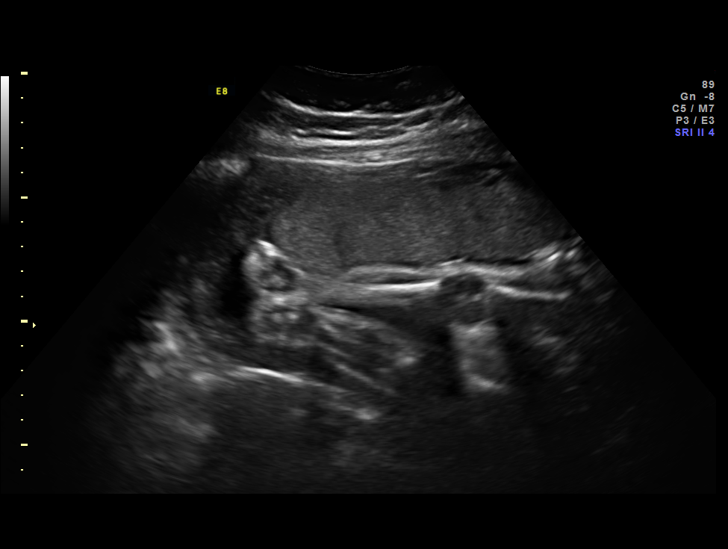
[im 6/49]
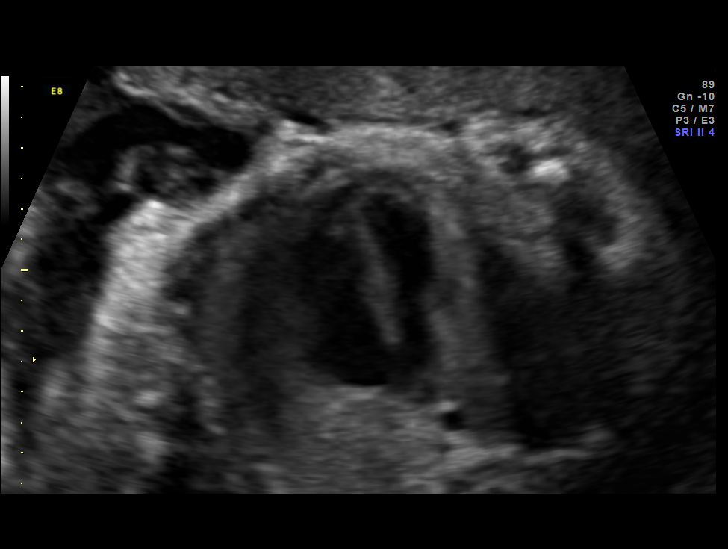
[im 9/49]
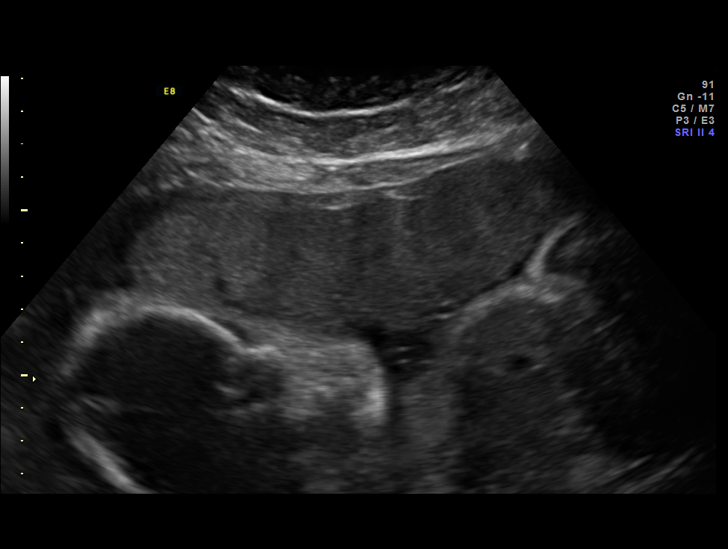
[im 15/49]
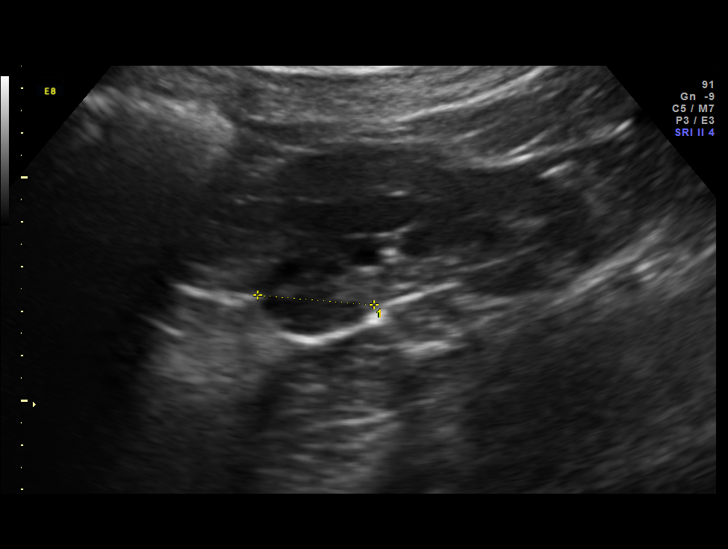
[im 18/49]
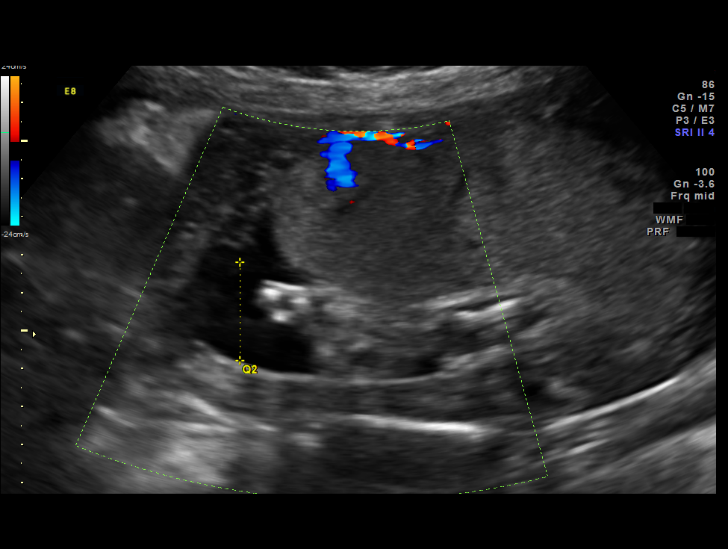
[im 22/49]
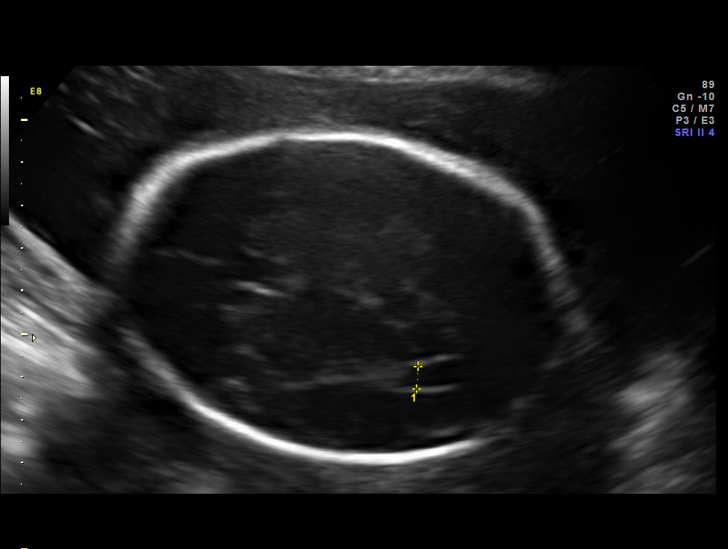
[im 27/49]
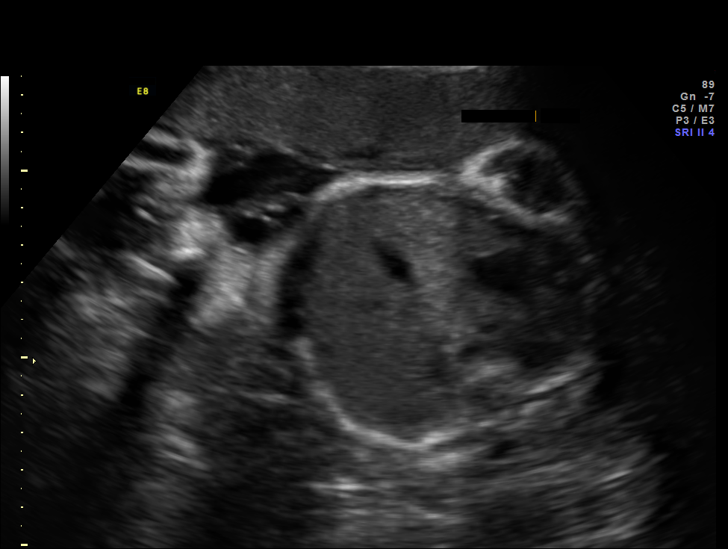
[im 31/49]
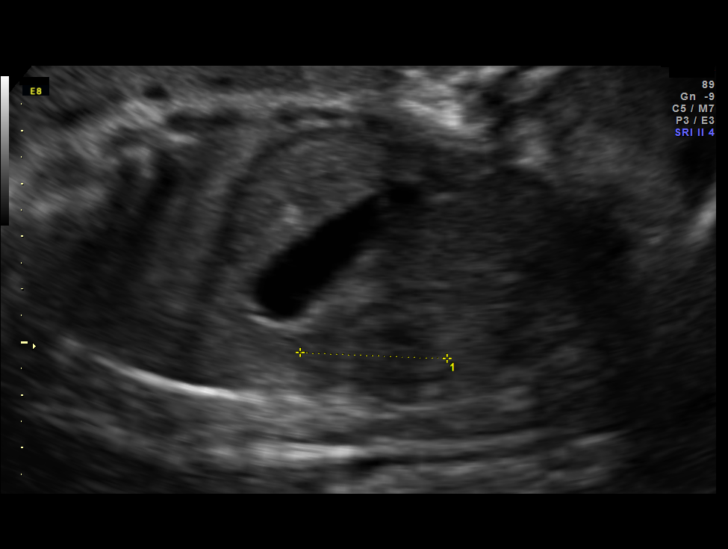
[im 34/49]
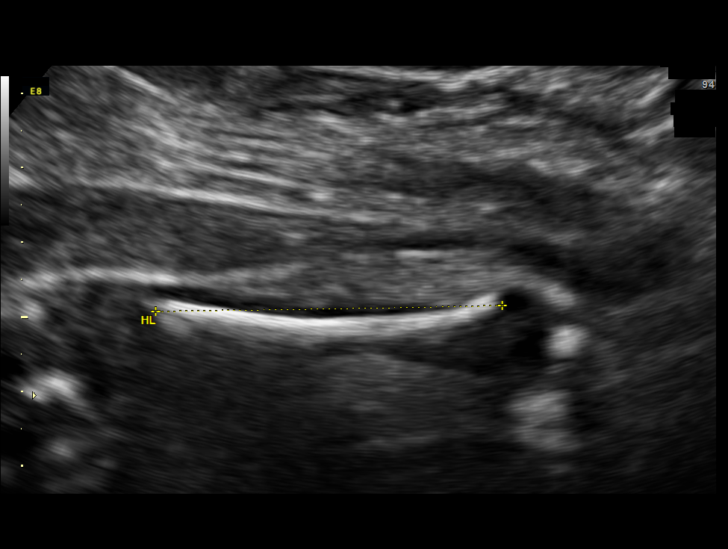
[im 40/49]
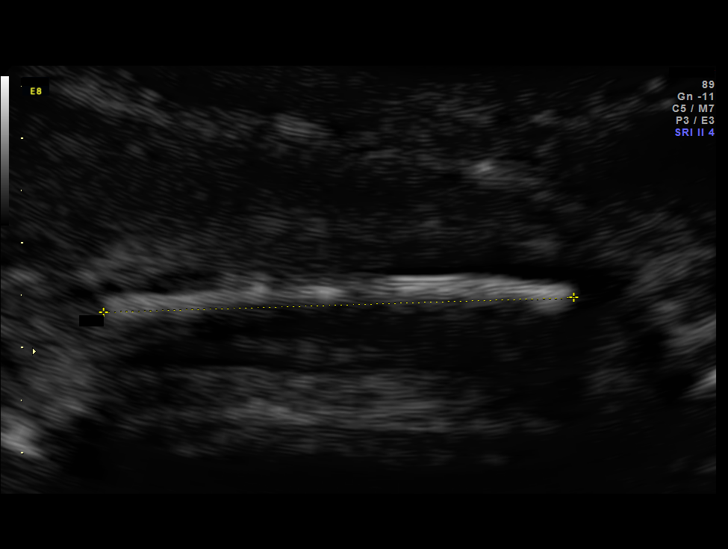
[im 43/49]
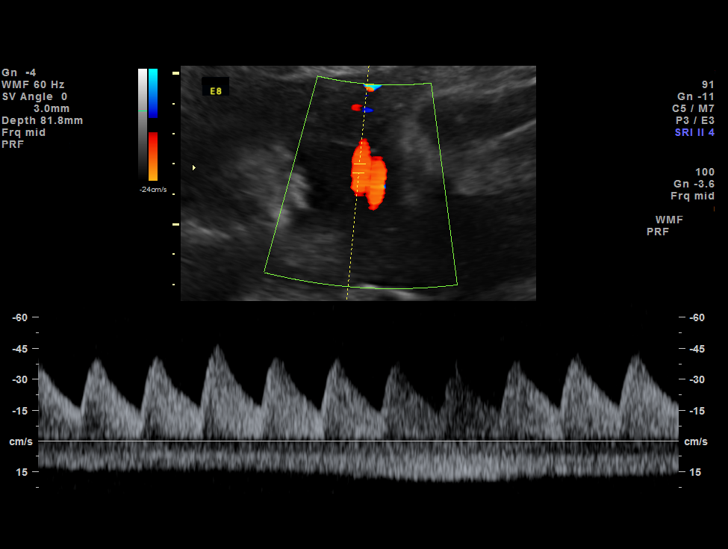
[im 47/49]
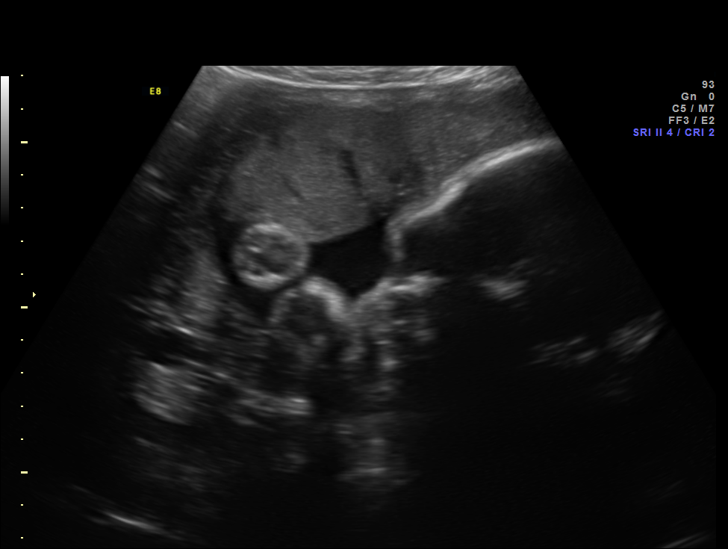

[12 of 28 positions shown; findings below may reference images not displayed]

OBSTETRICS REPORT
(Signed Final 03/02/2015 [DATE])

Date:

Service(s) Provided

Indications

30 weeks gestation of pregnancy
Gestational diabetes in pregnancy, diet controlled
Poor obstetric history: Previous preterm
deliveries (13 wk PROM with delivery at 25
weeks, 36 week PROM) - 17P
Poor obstetric history: Previous neonatal death
(pulmonary hypoplasia)
Previous cesarean section x3
Obesity complicating pregnancy, third trimester
Fetal Evaluation

Num Of             1
Fetuses:
Fetal Heart        144                          bpm
Rate:
Cardiac Activity:  Observed
Presentation:      Frank breech
Placenta:          Anterior, above cervical
os
P. Cord            Previously Visualized
Insertion:

Amniotic Fluid
AFI FV:      Subjectively low-normal
AFI Sum:     7.26     cm     < 3  %Tile     Larg Pckt:    3.34   cm
RUQ:   3.34    cm    RLQ:   0       cm   LUQ:    2.59    cm   LLQ:    1.33   cm
Biometry

BPD:     74.7   m    G. Age:   30w 0d                 CI:         72.3   70 - 86
m
OFD:    103.3   m                                     FL/HC:      18.0   19.2 -
m
HC:     285.5   m    G. Age:   31w 2d        50  %    HC/AC:      1.08   0.99 -
m
AC:     264.8   m    G. Age:   30w 4d        59  %    FL/BPD      68.8   71 - 87
m                                     :
FL:      51.4   m    G. Age:   27w 3d       < 3  %    FL/AC:      19.4   20 - 24
m
HUM:     46.2   m    G. Age:   27w 2d       < 5  %
m
ULN:     44.9   m    G. Age:   28w 6d        12  %
m
TIB:       48   m    G. Age:   29w 0d        25  %
m
RAD:     37.7   m    G. Age:   26w 2d        23  %
m
FIB:     46.1   m    G. Age:   28w 4d        45  %
m

Est.        1195   gm    3 lb 2 oz      43   %
FW:
Gestational Age

LMP:           31w 0d        Date:  07/28/14                  EDD:   05/04/15
U/S Today:     29w 6d                                         EDD:   05/12/15
Best:          30w 1d    Det. By:   Early Ultrasound          EDD:   05/10/15
(09/21/14)
Anatomy

Cranium:          Appears normal         Aortic Arch:       Previously seen
Fetal Cavum:      Appears normal         Ductal Arch:       Previously seen
Ventricles:       Appears normal         Diaphragm:         Previously seen
Choroid Plexus:   Previously seen        Stomach:           Appears normal,
left sided
Cerebellum:       Previously seen        Abdomen:           Appears normal
Posterior         Previously seen        Abdominal          Previously seen
Fossa:                                   Wall:
Nuchal Fold:      Previously seen        Cord Vessels:      Previously seen
Face:             Orbits and profile     Kidneys:           Appear normal
previously seen
Lips:             Previously seen        Bladder:           Appears normal
Heart:            Appears normal         Spine:             Previously seen
(4CH, axis, and
situs)
RVOT:             Previously seen        Lower              Previously seen
Extremities:
LVOT:             Previously seen        Upper              Previously seen
Extremities:

Other:   Female gender previously seen. Heels previously seen. Technically
difficult due to maternal habitus and fetal position.
Cervix Uterus Adnexa

Cervix:       Not visualized (advanced GA >07wks)

Left Ovary:    Within normal limits.
Right Ovary:   Within normal limits.

Adnexa:     No abnormality visualized.
Comments

Denies s/s of SROM; precautions given
Impression

SIUP at 30+1 weeks
Normal interval anatomy; anatomic survey complete
Low normal amniotic fluid volume
Appropriate interval growth with EFW at the 43rd %tile
Recommendations

Follow-up ultrasound in 2 weeks to reassess AFV

## 2017-02-06 IMAGING — US US MFM FETAL BPP W/O NON-STRESS
1 series · 12 of 12 positions shown · non-contrast
Comparison: none

[Series 1: us mfm fetal bpp w/o non-stress · 12 acquisitions, 12 frames shown]
[im 1/12]
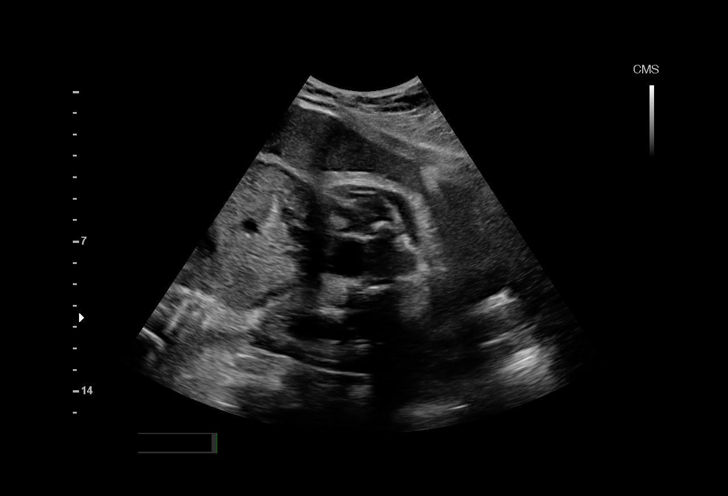
[im 2/12]
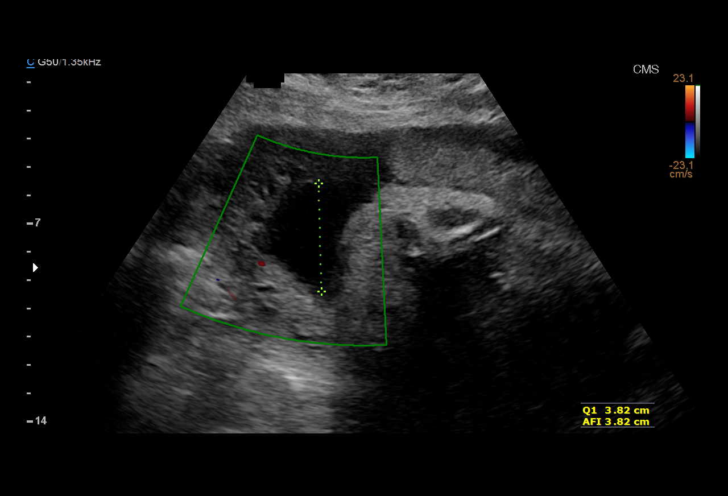
[im 3/12]
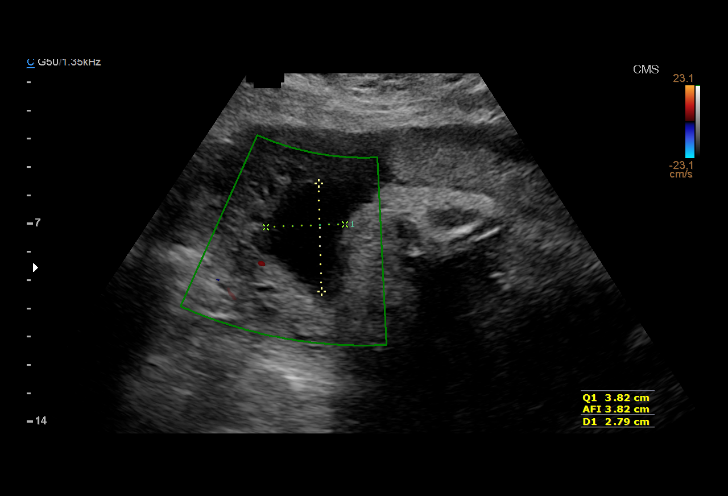
[im 4/12]
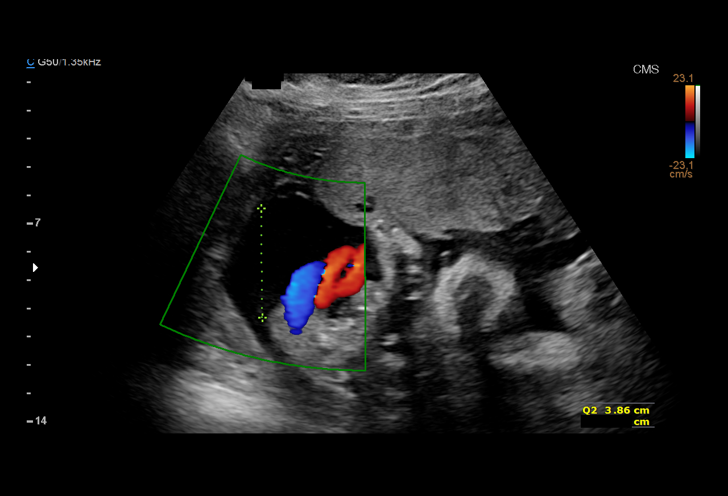
[im 5/12]
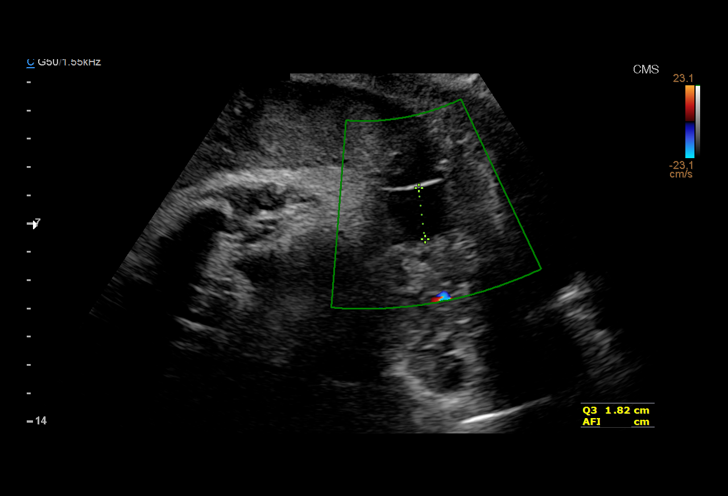
[im 6/12]
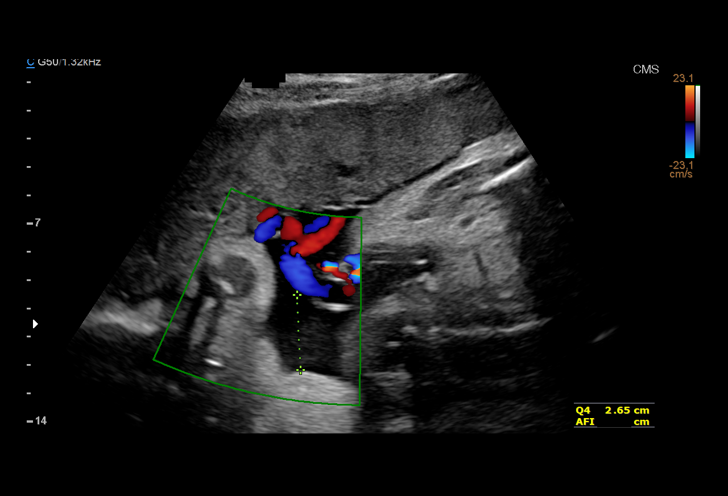
[im 7/12]
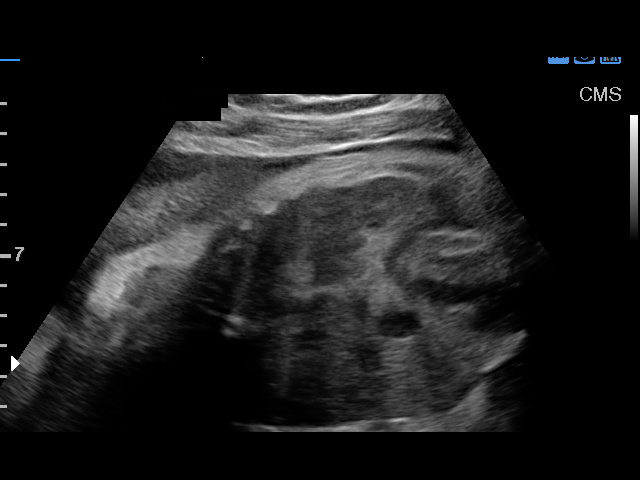
[im 8/12]
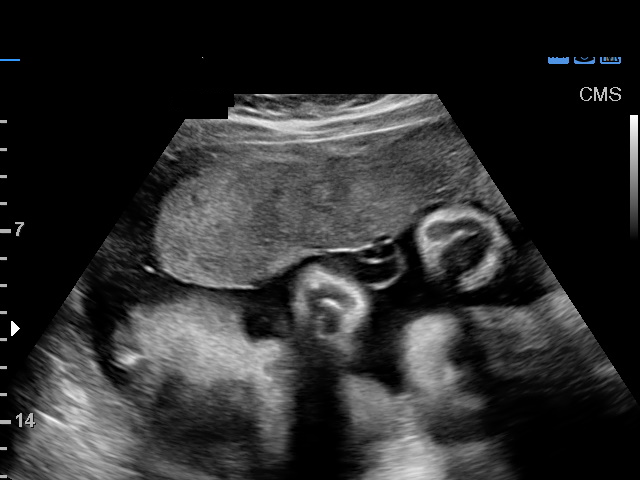
[im 9/12]
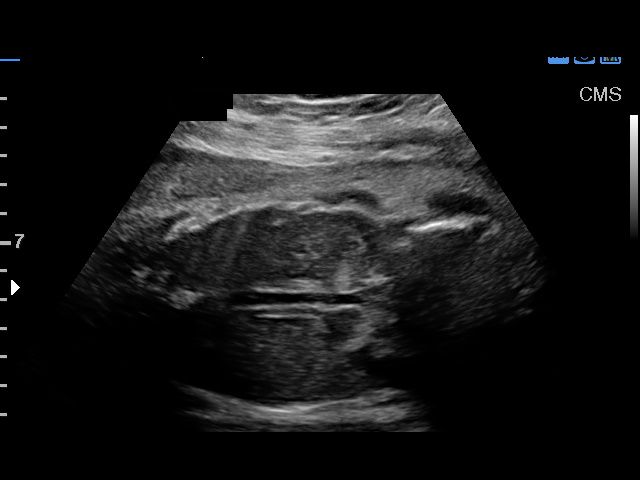
[im 10/12]
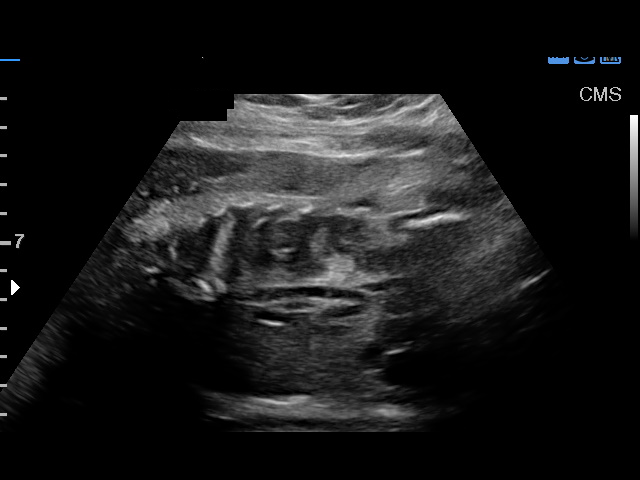
[im 11/12]
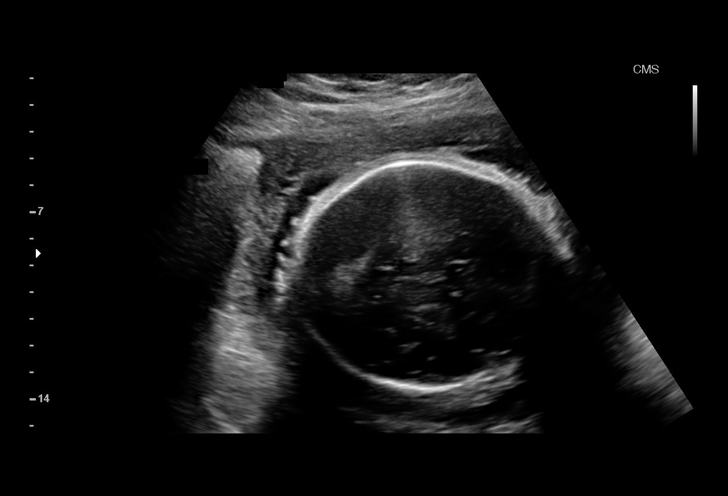
[im 12/12]
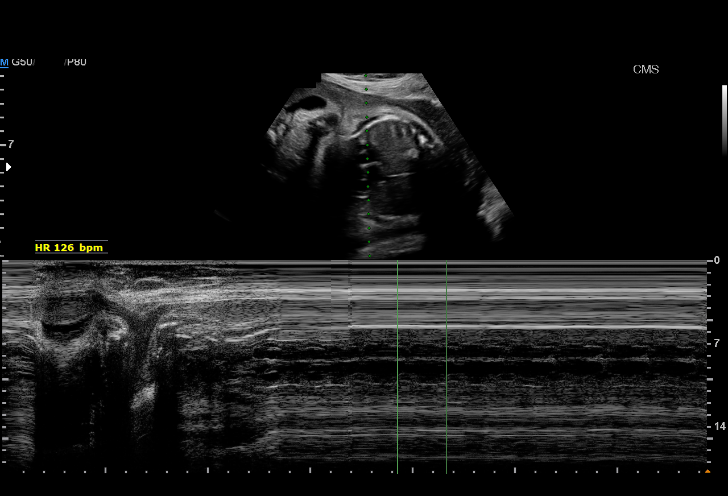

[12 of 12 positions shown; findings below may reference images not displayed]

OBSTETRICS REPORT
(Signed Final 03/16/2015 [DATE])

Date:

Faculty Physician
Service(s) Provided

Indications

32 weeks gestation of pregnancy
Gestational diabetes in pregnancy, controlled by
oral hypoglycemic drugs
Poor obstetric history: Previous preterm
deliveries (13 wk PROM with delivery at 25
weeks, 36 week PROM) - 17P
Poor obstetric history: Previous neonatal death
(pulmonary hypoplasia)
Previous cesarean section x3
Obesity complicating pregnancy, third trimester
Fetal Evaluation

Num Of             1
Fetuses:
Fetal Heart        126                          bpm
Rate:
Cardiac Activity:  Observed
Presentation:      Breech

Amniotic Fluid
AFI FV:      Subjectively within normal limits
AFI Sum:     12.15    cm      33  %Tile     Larg Pckt:    3.86   cm
RUQ:   3.82    cm    RLQ:   2.65    cm   LUQ:    3.86    cm   LLQ:    1.82   cm
Biophysical Evaluation

Amniotic F.V:   Within normal limits        F. Tone:        Observed
F. Movement:    Observed                    Score:          [DATE]
F. Breathing:   Observed
Gestational Age

LMP:           33w 0d        Date:  07/28/14                  EDD:   05/04/15
Best:          32w 1d    Det. By:   Early Ultrasound          EDD:   05/10/15
(09/21/14)
Impression

SIUP at 32+1 weeks
Breech presentation
Normal amniotic fluid volume
BPP [DATE] (GDM on glyburide)
Recommendations

Follow-up ultrasound for growth in 2 weeks

## 2017-03-08 IMAGING — US US MFM FETAL BPP W/O NON-STRESS
1 series · 11 of 11 positions shown · non-contrast
Comparison: none

[Series 1: us mfm fetal bpp w/o non-stress · 11 acquisitions, 11 frames shown]
[im 1/11]
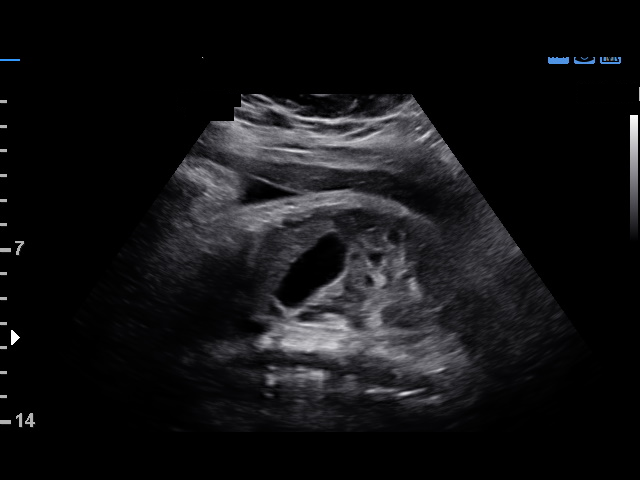
[im 2/11]
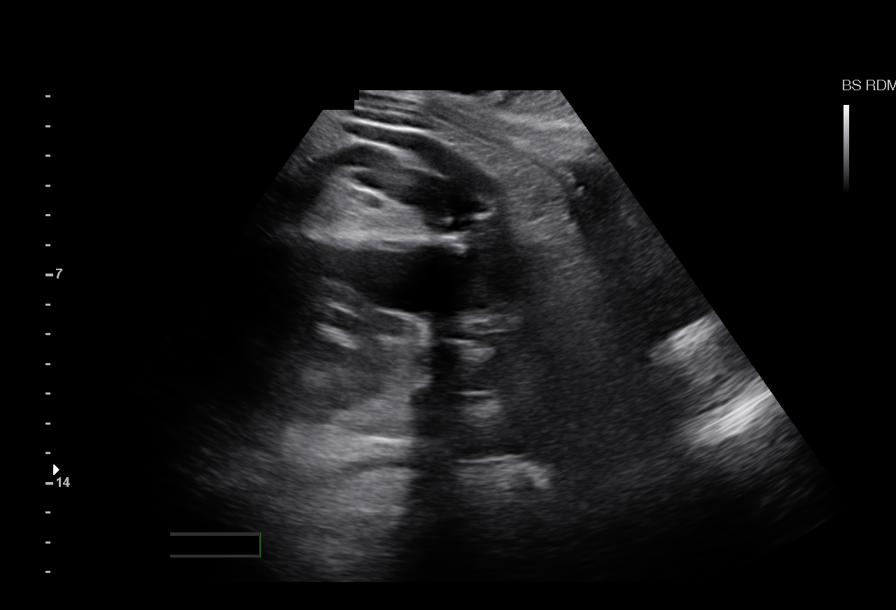
[im 3/11]
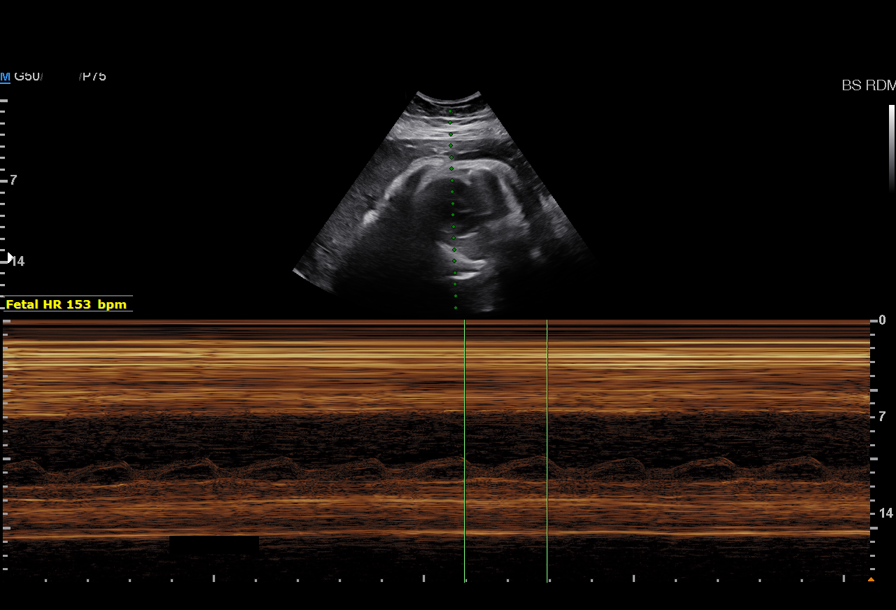
[im 4/11]
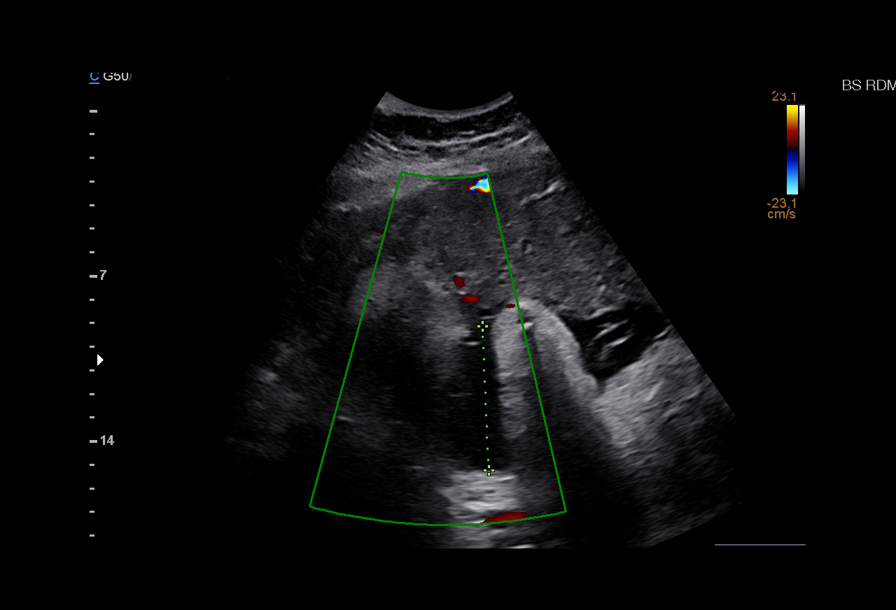
[im 5/11]
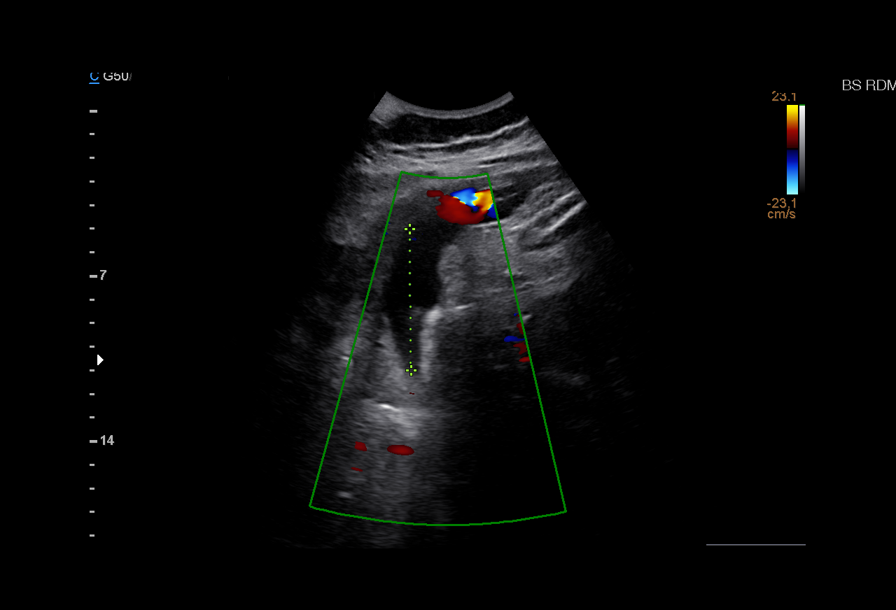
[im 6/11]
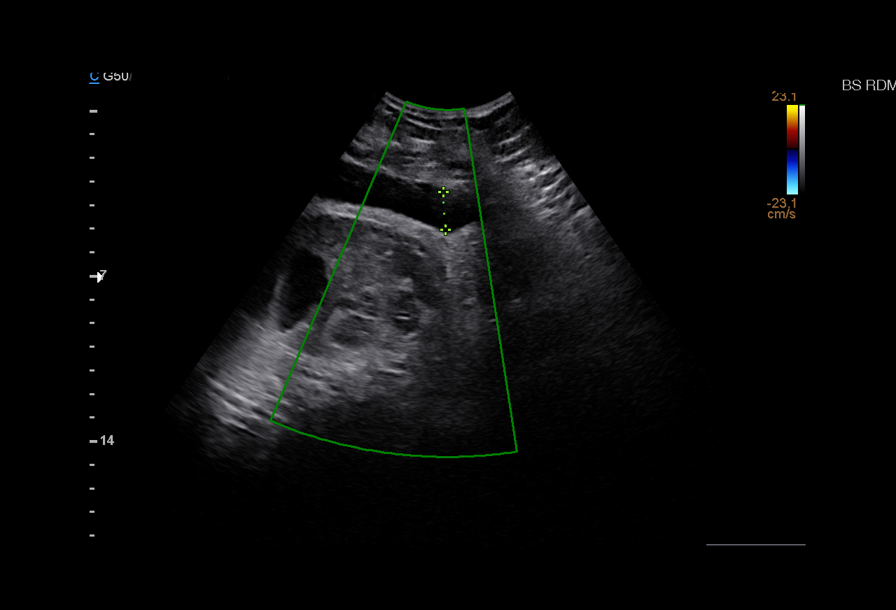
[im 7/11]
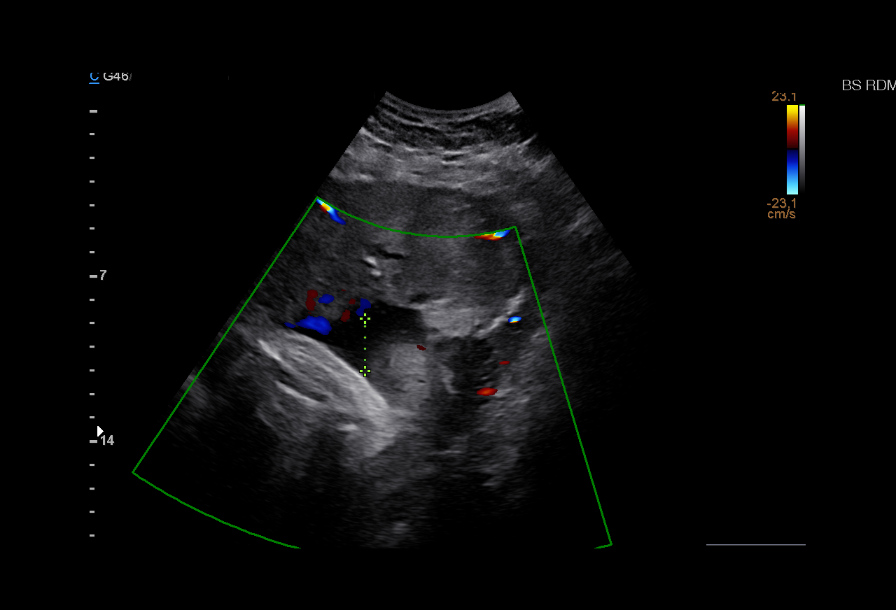
[im 8/11]
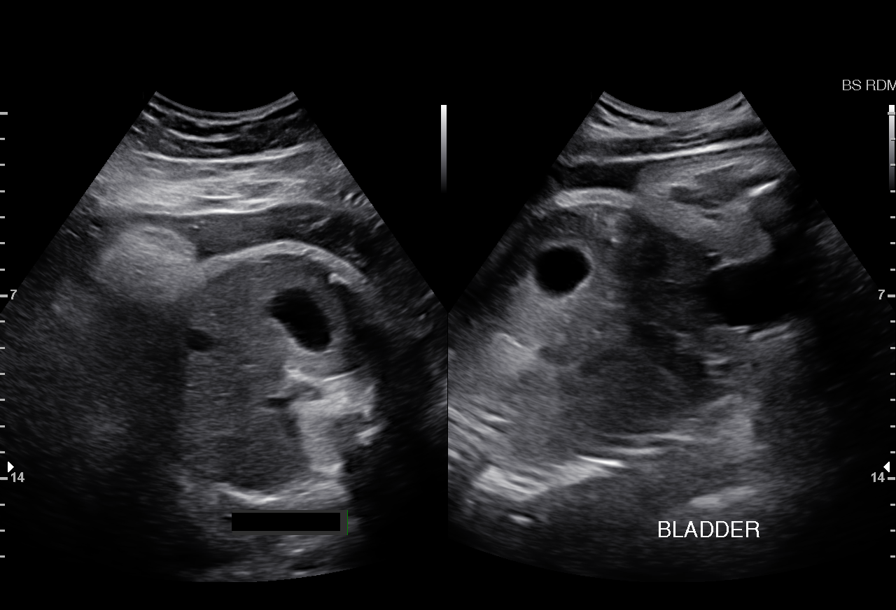
[im 9/11]
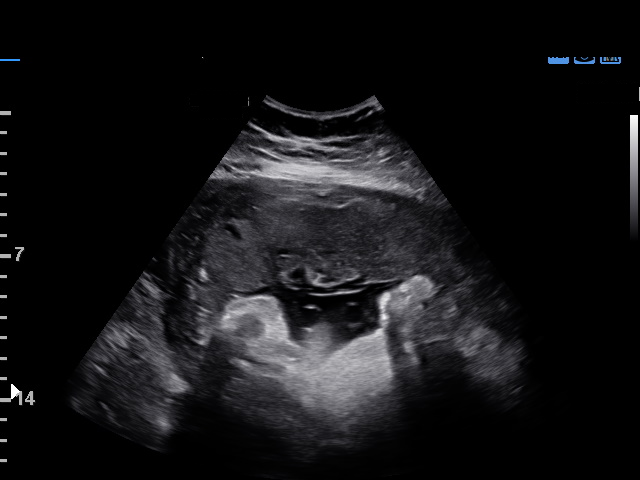
[im 10/11]
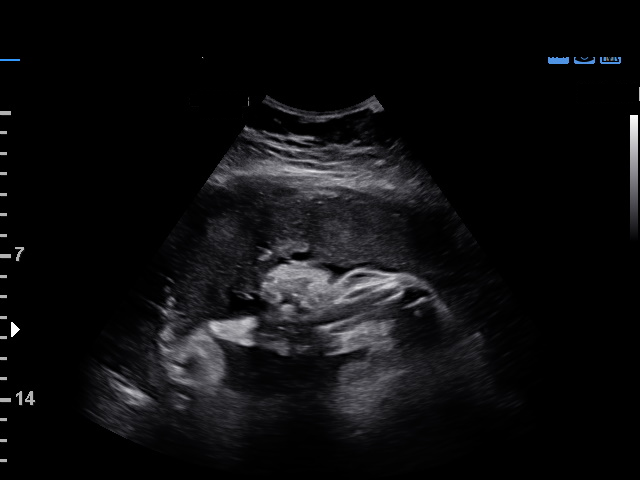
[im 11/11]
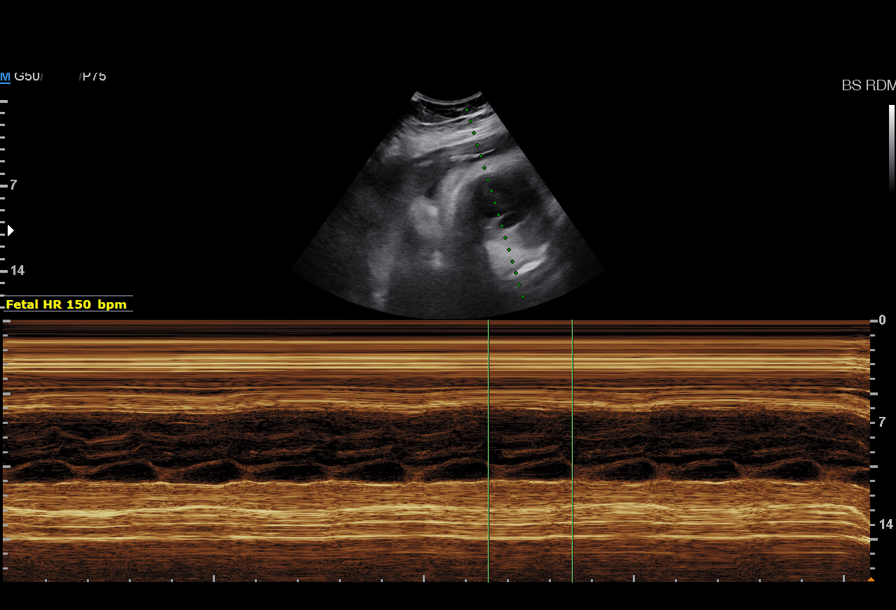

[11 of 11 positions shown; findings below may reference images not displayed]

OBSTETRICS REPORT
(Signed Final 04/16/2015 [DATE])

Name:       ARISSA BILLIOT                        Visit  04/15/2015 [DATE]
Date:

Service(s) Provided

Indications

Gestational diabetes in pregnancy, controlled by
oral hypoglycemic drugs
Poor obstetric history: Previous preterm
deliveries (13 wk PROM with delivery at 25
weeks, 36 week PROM) - 17P
Poor obstetric history: Previous neonatal death
(pulmonary hypoplasia)
Previous cesarean section x3
Obesity complicating pregnancy, third trimester
36 weeks gestation of pregnancy
Fetal Evaluation

Num Of             1
Fetuses:
Fetal Heart        150                          bpm
Rate:
Cardiac Activity:  Observed
Presentation:      Breech

Amniotic Fluid
AFI FV:      Subjectively within normal limits
AFI Sum:     15.9     cm      59  %Tile     Larg Pckt:      6.1  cm
RUQ:   6.1     cm    RLQ:   2.22    cm   LUQ:    5.98    cm   LLQ:    1.6    cm
Biophysical Evaluation

Amniotic F.V:   Pocket => 2 cm two          F. Tone:        Observed
planes
F. Movement:    Observed                    Score:          [DATE]
F. Breathing:   Observed
Gestational Age

LMP:           37w 2d        Date:  07/28/14                  EDD:   05/04/15
Best:          36w 3d    Det. By:   Early Ultrasound          EDD:   05/10/15
(09/21/14)
Impression

Single IUP at 36w 3d
Gestational diabetes on oral medications
BPP [DATE]
Normal amniotic fluid volume
Recommendations

Continue antenatal testing as scheduled

## 2017-03-13 IMAGING — US US MFM OB FOLLOW-UP
1 series · 14 of 25 positions shown · non-contrast
Comparison: none

[Series 1: us mfm ob follow-up · 14 of 25 slices shown]
[im 1/25]
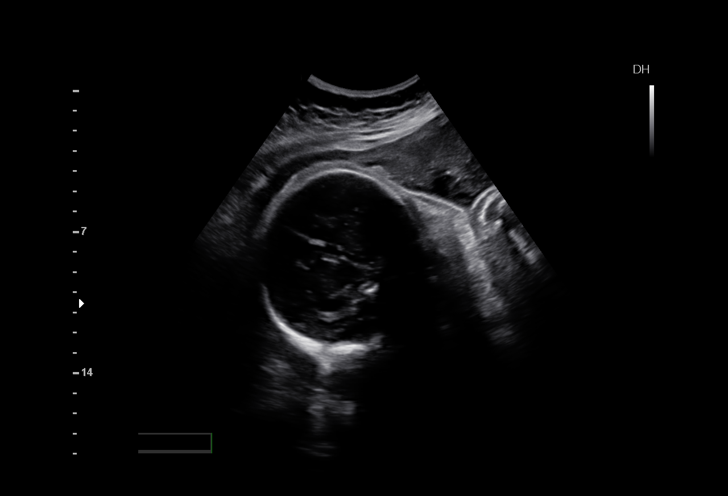
[im 3/25]
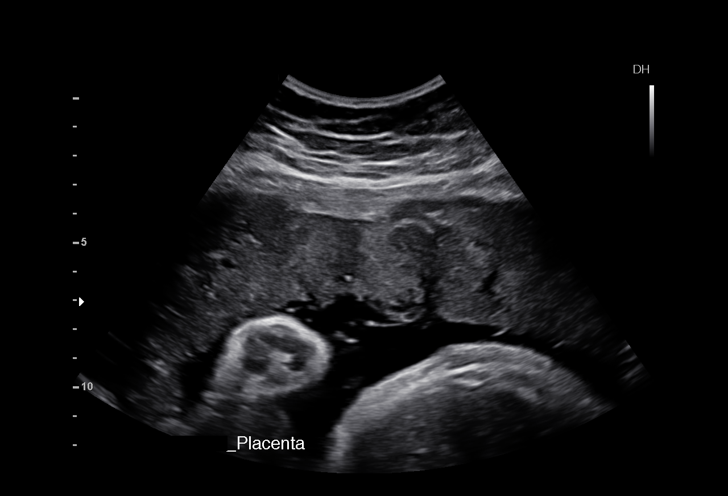
[im 5/25]
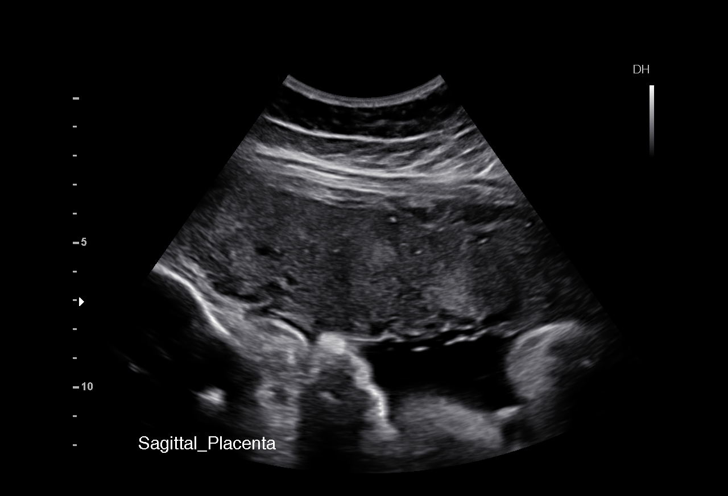
[im 7/25]
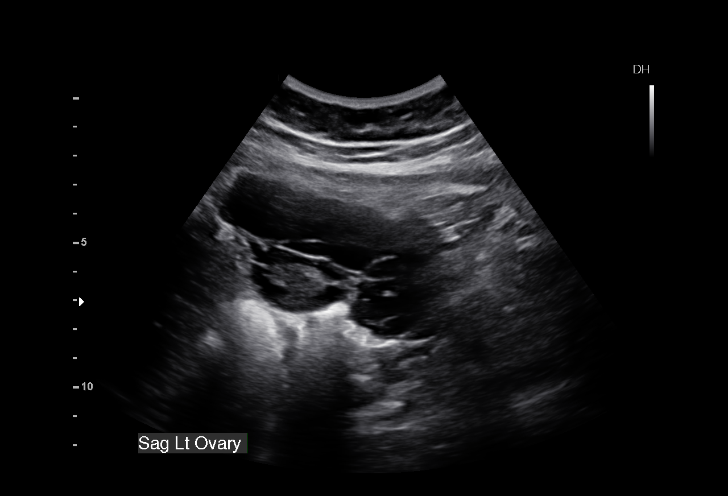
[im 9/25]
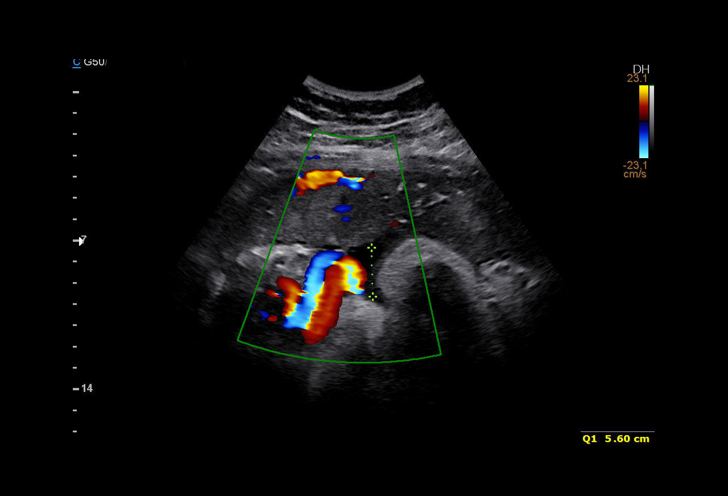
[im 10/25]
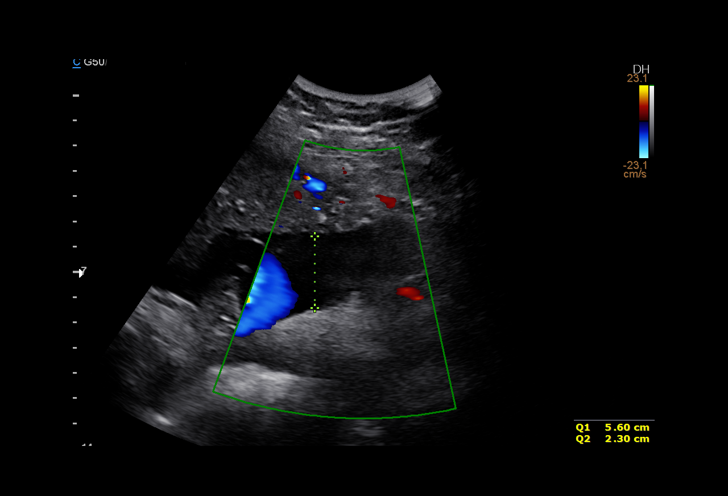
[im 12/25]
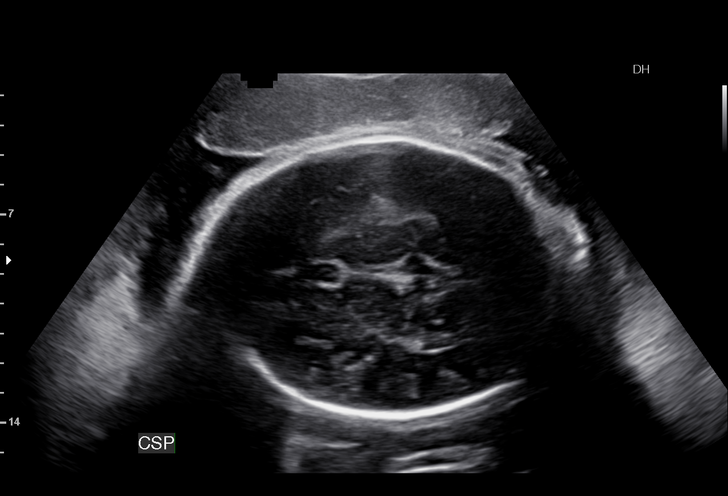
[im 14/25]
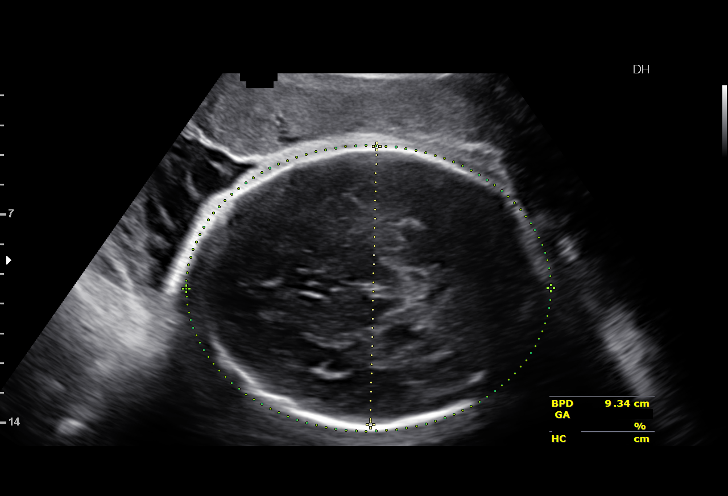
[im 16/25]
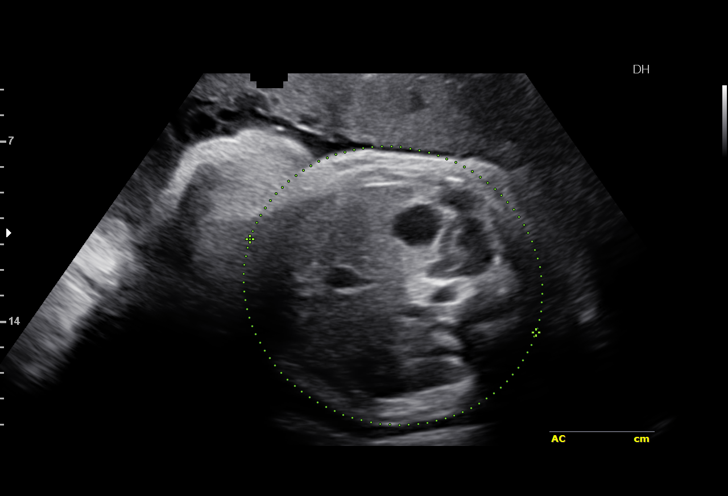
[im 17/25]
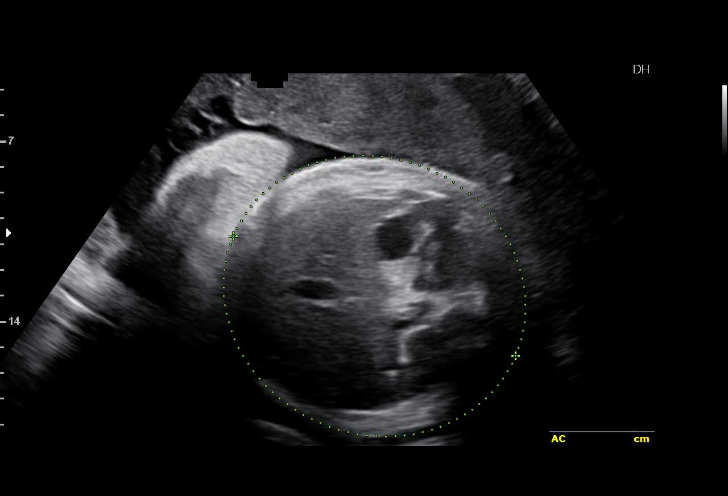
[im 19/25]
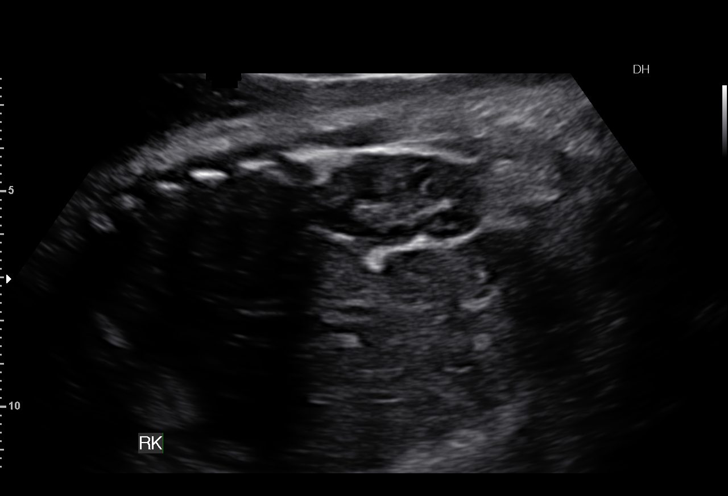
[im 21/25]
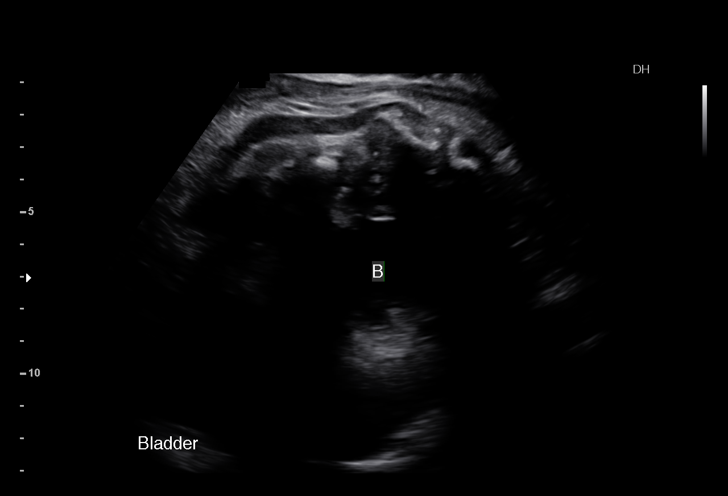
[im 23/25]
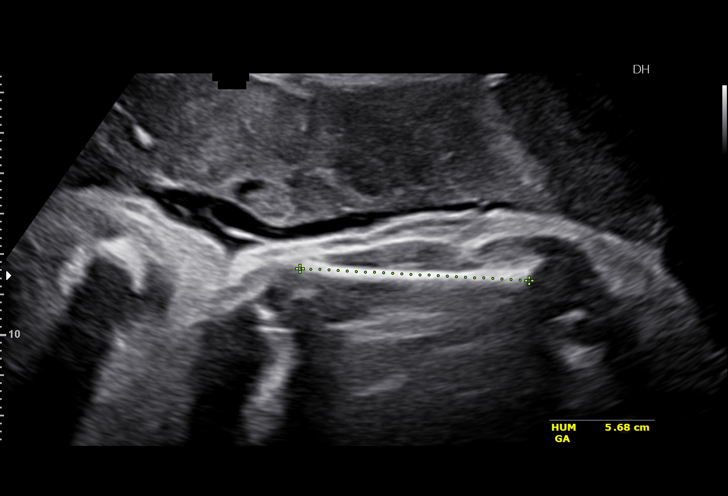
[im 25/25]
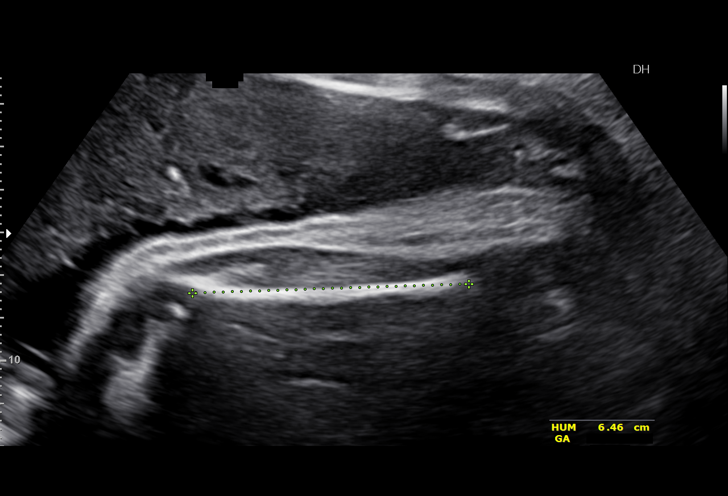

[14 of 25 positions shown; findings below may reference images not displayed]

[REDACTED]-
Faculty Physician

1  DORIEN BARRIENTES            [id number]      [phone]     [id number]
Service(s) Provided

Indications

37 weeks gestation of pregnancy
Gestational diabetes in pregnancy,
controlled by oral hypoglycemic drugs
Poor obstetric history: Previous preterm
deliveries (13 wk PROM with delivery at 25
weeks, 36 week PROM) - 17P
Poor obstetric history: Previous neonatal
death (pulmonary hypoplasia)
Previous cesarean delivery x3, antepartum
Obesity complicating pregnancy, third
trimester
OB History

Gravidity:    4         Term:   1        Prem:   2         SAB:   0
TOP:          0       Ectopic:  0        Living: 3
Fetal Evaluation

Num Of Fetuses:     1
Fetal Heart         158
Rate(bpm):
Cardiac Activity:   Observed
Presentation:       Breech
Placenta:           Anterior, above cervical os
P. Cord Insertion:  Previously Visualized
Amniotic Fluid
AFI FV:      Subjectively within normal limits
AFI Sum:     10.73    cm      29  %Tile      Larg Pckt:    5.6  cm
RUQ:   5.6     cm   LUQ:    2.3    cm    LLQ:   2.83    cm
Biometry

BPD:      93.5  mm     G. Age:  38w 0d                  CI:         75.09  %    70 - 86
FL/HC:       19.9  %    20.8 -
HC:      342.3  mm     G. Age:  39w 3d         79  %    HC/AC:       0.97       0.92 -
AC:      353.2  mm     G. Age:  39w 2d       > 97  %    FL/BPD:      72.8  %    71 - 87
FL:       68.1  mm     G. Age:  35w 0d          8  %    FL/AC:       19.3  %    20 - 24
HUM:      59.5  mm     G. Age:  34w 4d         20  %

Est. FW:    6044   gm     7 lb 9 oz     87  %
Gestational Age

LMP:           38w 0d        Date:  07/28/14                 EDD:    05/04/15
U/S Today:     38w 0d                                        EDD:    05/04/15
Best:          37w 1d     Det. By:  Early Ultrasound         EDD:    05/10/15
(09/21/14)
Anatomy

Cranium:          Appears normal         Aortic Arch:      Previously seen
Fetal Cavum:      Appears normal         Ductal Arch:      Previously seen
Ventricles:       Appears normal         Diaphragm:        Previously seen
Choroid Plexus:   Previously seen        Stomach:          Appears normal, left
sided
Cerebellum:       Previously seen        Abdomen:          Appears normal
Posterior Fossa:  Previously seen        Abdominal Wall:   Previously seen
Nuchal Fold:      Previously seen        Cord Vessels:     Previously seen
Face:             Orbits and profile     Kidneys:          Appear normal
previously seen
Lips:             Previously seen        Bladder:          Appears normal
Heart:            Previously seen        Spine:            Previously seen
RVOT:             Previously seen        Upper             Previously seen
Extremities:
LVOT:             Previously seen        Lower             Previously seen
Extremities:

Other:  Female gender previously seen. Heels previously seen. Technically
difficult due to maternal habitus and fetal position.
Cervix Uterus Adnexa

Cervix
Not visualized (advanced GA >17wks)

Left Ovary
Within normal limits.

Right Ovary
Within normal limits.

Adnexa:       No abnormality visualized.
Impression

SIUP at 37+1 weeks
Breech presentation
Normal interval anatomy; anatomic survey complete
Normal amniotic fluid volume
Appropriate interval growth with EFW at the 87th %tile; AC >
97th %tile
Recommendations

Continue twice weekly NSTs with weekly AFIs
Repeat C/S at 39 weeks or before if clinically indicated
